# Patient Record
Sex: Female | Born: 1970 | Race: White | Hispanic: No | Marital: Married | Smoking: Never smoker
Health system: Southern US, Community
[De-identification: ages and names within clinical notes are randomized; demographics above are authoritative.]

## PROBLEM LIST (undated history)

## (undated) DIAGNOSIS — F419 Anxiety disorder, unspecified: Secondary | ICD-10-CM

## (undated) DIAGNOSIS — G4700 Insomnia, unspecified: Secondary | ICD-10-CM

## (undated) DIAGNOSIS — K589 Irritable bowel syndrome without diarrhea: Secondary | ICD-10-CM

## (undated) DIAGNOSIS — G43909 Migraine, unspecified, not intractable, without status migrainosus: Secondary | ICD-10-CM

## (undated) DIAGNOSIS — K219 Gastro-esophageal reflux disease without esophagitis: Secondary | ICD-10-CM

## (undated) DIAGNOSIS — R002 Palpitations: Secondary | ICD-10-CM

## (undated) HISTORY — DX: Palpitations: R00.2

## (undated) HISTORY — DX: Anxiety disorder, unspecified: F41.9

## (undated) HISTORY — PX: ABDOMINAL HYSTERECTOMY: SHX81

## (undated) HISTORY — DX: Insomnia, unspecified: G47.00

---

## 1997-04-21 ENCOUNTER — Inpatient Hospital Stay (HOSPITAL_COMMUNITY): Admission: AD | Admit: 1997-04-21 | Discharge: 1997-04-26 | Payer: Self-pay | Admitting: Obstetrics & Gynecology

## 1997-07-07 ENCOUNTER — Other Ambulatory Visit: Admission: RE | Admit: 1997-07-07 | Discharge: 1997-07-07 | Payer: Self-pay | Admitting: *Deleted

## 1997-11-18 ENCOUNTER — Ambulatory Visit (HOSPITAL_COMMUNITY): Admission: RE | Admit: 1997-11-18 | Discharge: 1997-11-18 | Payer: Self-pay | Admitting: Family Medicine

## 1999-09-20 ENCOUNTER — Emergency Department (HOSPITAL_COMMUNITY): Admission: EM | Admit: 1999-09-20 | Discharge: 1999-09-20 | Payer: Self-pay | Admitting: Emergency Medicine

## 1999-09-21 ENCOUNTER — Encounter: Payer: Self-pay | Admitting: Emergency Medicine

## 2000-04-12 ENCOUNTER — Emergency Department (HOSPITAL_COMMUNITY): Admission: EM | Admit: 2000-04-12 | Discharge: 2000-04-12 | Payer: Self-pay | Admitting: Emergency Medicine

## 2000-07-25 ENCOUNTER — Other Ambulatory Visit: Admission: RE | Admit: 2000-07-25 | Discharge: 2000-07-25 | Payer: Self-pay | Admitting: *Deleted

## 2000-08-12 ENCOUNTER — Encounter: Payer: Self-pay | Admitting: Family Medicine

## 2000-08-12 ENCOUNTER — Ambulatory Visit (HOSPITAL_COMMUNITY): Admission: RE | Admit: 2000-08-12 | Discharge: 2000-08-12 | Payer: Self-pay | Admitting: Family Medicine

## 2001-05-13 ENCOUNTER — Inpatient Hospital Stay (HOSPITAL_COMMUNITY): Admission: AD | Admit: 2001-05-13 | Discharge: 2001-05-13 | Payer: Self-pay | Admitting: Obstetrics & Gynecology

## 2001-05-19 ENCOUNTER — Other Ambulatory Visit: Admission: RE | Admit: 2001-05-19 | Discharge: 2001-05-19 | Payer: Self-pay | Admitting: Obstetrics & Gynecology

## 2001-09-07 ENCOUNTER — Inpatient Hospital Stay (HOSPITAL_COMMUNITY): Admission: AD | Admit: 2001-09-07 | Discharge: 2001-09-07 | Payer: Self-pay | Admitting: Obstetrics and Gynecology

## 2001-09-09 ENCOUNTER — Inpatient Hospital Stay (HOSPITAL_COMMUNITY): Admission: AD | Admit: 2001-09-09 | Discharge: 2001-09-10 | Payer: Self-pay | Admitting: *Deleted

## 2001-09-18 ENCOUNTER — Inpatient Hospital Stay (HOSPITAL_COMMUNITY): Admission: AD | Admit: 2001-09-18 | Discharge: 2001-09-20 | Payer: Self-pay | Admitting: *Deleted

## 2001-09-30 ENCOUNTER — Inpatient Hospital Stay (HOSPITAL_COMMUNITY): Admission: AD | Admit: 2001-09-30 | Discharge: 2001-10-03 | Payer: Self-pay | Admitting: Obstetrics and Gynecology

## 2001-10-01 ENCOUNTER — Encounter: Payer: Self-pay | Admitting: Obstetrics and Gynecology

## 2001-10-02 ENCOUNTER — Encounter: Payer: Self-pay | Admitting: Obstetrics and Gynecology

## 2001-10-20 ENCOUNTER — Inpatient Hospital Stay (HOSPITAL_COMMUNITY): Admission: AD | Admit: 2001-10-20 | Discharge: 2001-10-20 | Payer: Self-pay | Admitting: Obstetrics and Gynecology

## 2001-11-03 ENCOUNTER — Inpatient Hospital Stay (HOSPITAL_COMMUNITY): Admission: AD | Admit: 2001-11-03 | Discharge: 2001-11-03 | Payer: Self-pay | Admitting: Obstetrics and Gynecology

## 2001-11-05 ENCOUNTER — Inpatient Hospital Stay (HOSPITAL_COMMUNITY): Admission: AD | Admit: 2001-11-05 | Discharge: 2001-11-08 | Payer: Self-pay | Admitting: Obstetrics and Gynecology

## 2001-11-06 ENCOUNTER — Encounter: Payer: Self-pay | Admitting: Obstetrics and Gynecology

## 2001-11-07 ENCOUNTER — Encounter: Payer: Self-pay | Admitting: *Deleted

## 2001-11-11 ENCOUNTER — Inpatient Hospital Stay (HOSPITAL_COMMUNITY): Admission: AD | Admit: 2001-11-11 | Discharge: 2001-11-11 | Payer: Self-pay | Admitting: Obstetrics and Gynecology

## 2001-11-12 ENCOUNTER — Encounter: Payer: Self-pay | Admitting: Obstetrics and Gynecology

## 2001-11-12 ENCOUNTER — Inpatient Hospital Stay (HOSPITAL_COMMUNITY): Admission: AD | Admit: 2001-11-12 | Discharge: 2001-11-12 | Payer: Self-pay | Admitting: Obstetrics and Gynecology

## 2001-11-20 ENCOUNTER — Inpatient Hospital Stay (HOSPITAL_COMMUNITY): Admission: AD | Admit: 2001-11-20 | Discharge: 2001-11-22 | Payer: Self-pay | Admitting: Obstetrics and Gynecology

## 2001-12-28 ENCOUNTER — Other Ambulatory Visit: Admission: RE | Admit: 2001-12-28 | Discharge: 2001-12-28 | Payer: Self-pay | Admitting: *Deleted

## 2003-01-20 ENCOUNTER — Other Ambulatory Visit: Admission: RE | Admit: 2003-01-20 | Discharge: 2003-01-20 | Payer: Self-pay | Admitting: *Deleted

## 2004-01-25 ENCOUNTER — Other Ambulatory Visit: Admission: RE | Admit: 2004-01-25 | Discharge: 2004-01-25 | Payer: Self-pay | Admitting: Obstetrics and Gynecology

## 2004-05-02 ENCOUNTER — Ambulatory Visit: Payer: Self-pay | Admitting: Gastroenterology

## 2004-05-08 ENCOUNTER — Ambulatory Visit (HOSPITAL_COMMUNITY): Admission: RE | Admit: 2004-05-08 | Discharge: 2004-05-08 | Payer: Self-pay | Admitting: Gastroenterology

## 2004-05-08 ENCOUNTER — Encounter (INDEPENDENT_AMBULATORY_CARE_PROVIDER_SITE_OTHER): Payer: Self-pay | Admitting: *Deleted

## 2004-05-08 ENCOUNTER — Ambulatory Visit: Payer: Self-pay | Admitting: Gastroenterology

## 2004-05-16 ENCOUNTER — Ambulatory Visit: Payer: Self-pay | Admitting: Gastroenterology

## 2004-05-18 ENCOUNTER — Ambulatory Visit: Payer: Self-pay | Admitting: Gastroenterology

## 2004-05-23 ENCOUNTER — Encounter: Admission: RE | Admit: 2004-05-23 | Discharge: 2004-05-23 | Payer: Self-pay | Admitting: Family Medicine

## 2004-05-31 ENCOUNTER — Ambulatory Visit: Payer: Self-pay | Admitting: Gastroenterology

## 2004-06-04 ENCOUNTER — Ambulatory Visit (HOSPITAL_COMMUNITY): Admission: RE | Admit: 2004-06-04 | Discharge: 2004-06-04 | Payer: Self-pay | Admitting: Gastroenterology

## 2004-06-27 ENCOUNTER — Ambulatory Visit: Payer: Self-pay | Admitting: Gastroenterology

## 2004-12-13 ENCOUNTER — Ambulatory Visit: Payer: Self-pay | Admitting: Gastroenterology

## 2004-12-31 ENCOUNTER — Ambulatory Visit: Payer: Self-pay | Admitting: Gastroenterology

## 2005-02-27 ENCOUNTER — Other Ambulatory Visit: Admission: RE | Admit: 2005-02-27 | Discharge: 2005-02-27 | Payer: Self-pay | Admitting: Obstetrics and Gynecology

## 2005-05-30 ENCOUNTER — Ambulatory Visit (HOSPITAL_COMMUNITY): Admission: RE | Admit: 2005-05-30 | Discharge: 2005-06-01 | Payer: Self-pay | Admitting: Obstetrics and Gynecology

## 2005-05-30 ENCOUNTER — Encounter (INDEPENDENT_AMBULATORY_CARE_PROVIDER_SITE_OTHER): Payer: Self-pay | Admitting: *Deleted

## 2009-11-29 ENCOUNTER — Ambulatory Visit: Payer: Self-pay | Admitting: Family Medicine

## 2009-11-29 DIAGNOSIS — S139XXA Sprain of joints and ligaments of unspecified parts of neck, initial encounter: Secondary | ICD-10-CM

## 2009-11-29 DIAGNOSIS — G43009 Migraine without aura, not intractable, without status migrainosus: Secondary | ICD-10-CM | POA: Insufficient documentation

## 2009-11-30 ENCOUNTER — Telehealth (INDEPENDENT_AMBULATORY_CARE_PROVIDER_SITE_OTHER): Payer: Self-pay | Admitting: *Deleted

## 2009-11-30 ENCOUNTER — Encounter: Payer: Self-pay | Admitting: Family Medicine

## 2010-04-10 ENCOUNTER — Ambulatory Visit
Admission: RE | Admit: 2010-04-10 | Discharge: 2010-04-10 | Payer: Self-pay | Source: Home / Self Care | Admitting: Family Medicine

## 2010-04-10 ENCOUNTER — Encounter: Payer: Self-pay | Admitting: Family Medicine

## 2010-04-10 LAB — CONVERTED CEMR LAB: Rapid Strep: NEGATIVE

## 2010-04-10 NOTE — Assessment & Plan Note (Signed)
Summary: SEVERE HEADACHE/TJ Room 5   Vital Signs:  Patient Profile:   40 Years Old Female CC:      Headache since last night, stiff neck Height:     64 inches Weight:      144 pounds O2 Sat:      99 % Temp:     99.4 degrees F oral Pulse rate:   114 / minute Pulse rhythm:   regular Resp:     16 per minute BP sitting:   110 / 78  (left arm) Cuff size:   regular  Pt. in pain?   yes    Location:   head    Intensity:   7    Type:       aching  Vitals Entered By: Emilio Math, CMA                   Current Allergies: ! CODEINE    History of Present Illness Chief Complaint: Headache since last night, stiff neck History of Present Illness:  Subjective:  Patient has a history of recurring migraine headaches (about one per week) that usually respond to Maxalt.  About 2 to 3 days ago she developed some vague soreness in her left neck and posterior shoulder area that persisted.  At about 2AM today she began developing a typical right side migraine headache.  She took a Maxalt this morning without improvement, and later two Aleves without improvement.  She continues to soreness in her left neck and right sided headache. She is having mild nausea without vomiting.  No recent fevers, chills, and sweats.  No recent strenuous physical activity involving the left arm.  No known tick bites.  No respiratory symptoms. She states that she has had Toradol and Phenergan injection in the past without adverse effect.  Current Meds PAXIL 40 MG TABS (PAROXETINE HCL)  ZANTAC 300 MG TABS (RANITIDINE HCL)  CLARITIN 10 MG TABS (LORATADINE)  NORTRIPTYLINE HCL 10 MG CAPS (NORTRIPTYLINE HCL)  MAXALT 10 MG TABS (RIZATRIPTAN BENZOATE)  NAPROSYN 500 MG TABS (NAPROXEN)   REVIEW OF SYSTEMS Constitutional Symptoms      Denies fever, chills, night sweats, weight loss, weight gain, and fatigue.  Eyes       Denies change in vision, eye pain, eye discharge, glasses, contact lenses, and eye  surgery. Ear/Nose/Throat/Mouth       Denies hearing loss/aids, change in hearing, ear pain, ear discharge, dizziness, frequent runny nose, frequent nose bleeds, sinus problems, sore throat, hoarseness, and tooth pain or bleeding.  Respiratory       Denies dry cough, productive cough, wheezing, shortness of breath, asthma, bronchitis, and emphysema/COPD.  Cardiovascular       Denies murmurs, chest pain, and tires easily with exhertion.    Gastrointestinal       Denies stomach pain, nausea/vomiting, diarrhea, constipation, blood in bowel movements, and indigestion. Genitourniary       Denies painful urination, kidney stones, and loss of urinary control. Neurological       Complains of headaches and tremors.      Denies paralysis, seizures, and fainting/blackouts. Musculoskeletal       Complains of muscle pain and joint pain.      Denies joint stiffness, decreased range of motion, redness, swelling, muscle weakness, and gout.      Comments: Neck Skin       Denies bruising, unusual mles/lumps or sores, and hair/skin or nail changes.  Psych       Denies mood  changes, temper/anger issues, anxiety/stress, speech problems, depression, and sleep problems.  Past History:  Past Medical History: IBS Migraines  Past Surgical History: Hysterectomy  Family History: Father, COPD, HTN, Diabetes, Congestive Heart Failure Mother, Healthy  Social History: Non-smoker ETOH yes Drugs No Chemist   Objective:  Appearance:  Patient appears healthy, stated age, and in no acute distress  Eyes:  Pupils are equal, round, and reactive to light and accomdation.  Extraocular movement is intact.  Conjunctivae are not inflamed.  Fundi normal.  No photophobia. Ears:  Canals normal.  Tympanic membranes normal.   Nose:  Normal septum.  Normal turbinates, mildly congested.    No sinus tenderness present. Mouth:  Tongue midline Neck:  Supple.  No adenopathy is present.  No thyromegaly is present.  Mild  tenderness left trapezius and sternocleidomastoid muscles.   Left shoulder:  Tenderness along medial edge of left scapula.  Tenderness elicited with resisted abduction of left shoulder. Lungs:  Clear to auscultation.  Breath sounds are equal.  Heart:  Regular rate and rhythm without murmurs, rubs, or gallops.  Abdomen:  Nontender without masses or hepatosplenomegaly.  Bowel sounds are present.  No CVA or flank tenderness.  Skin:  No rash Neurologic:  Cranial nerves 2 through 12 are normal.  Patellar, achilles, and elbow reflexes are normal.  Cerebellar function is intact (finger to nose and rapid alternating movements)   Gait and station are normal.    CBC:  WBC 7.0; Hgb 13.3 Assessment New Problems: CERVICAL SPASM (ICD-847.0) MIGRAINE, COMMON (ICD-346.10)  SUSPECT LEFT RHOMBOID MUSCLE SPASM AS WELL.  Plan New Orders: Promethazine up to 50mg  [J2550] Admin of Therapeutic Inj  intramuscular or subcutaneous [96372] Ketorolac-Toradol 15mg  [J1885] New Patient Level III [99203] Planning Comments:   Toradol 60mg  IM and Phenergan 25mg  IM. Apply ice pack to painful areas 2 or 3 times daily.  Begin neck and left shoulder range of motion exercises in 2 or 3 days ([facility] information and instruction patient handout given)  Follow-up with PCP if not improving.  Return for worsening symptoms   The patient and/or caregiver has been counseled thoroughly with regard to medications prescribed including dosage, schedule, interactions, rationale for use, and possible side effects and they verbalize understanding.  Diagnoses and expected course of recovery discussed and will return if not improved as expected or if the condition worsens. Patient and/or caregiver verbalized understanding.   Medication Administration  Injection # 1:    Medication: Ketorolac-Toradol 15mg     Diagnosis: MIGRAINE, COMMON (ICD-346.10)    Route: IM    Site: RUOQ gluteus    Exp Date: 08/10/2011    Lot #: [id number]     Mfr: Hospira    Comments: Administered 60mg     Patient tolerated injection without complications    Given by: Areta Haber CMA (November 29, 2009 5:58 PM)  Injection # 2:    Medication: Promethazine up to 50mg     Diagnosis: MIGRAINE, COMMON (ICD-346.10)    Route: IM    Site: LUOQ gluteus    Exp Date: 06/09/2011    Lot #: [id number]    Mfr: Baxter    Comments: Administered 25mg     Patient tolerated injection without complications    Given by: Areta Haber CMA (November 29, 2009 5:59 PM)  Orders Added: 1)  Promethazine up to 50mg  [J2550] 2)  Admin of Therapeutic Inj  intramuscular or subcutaneous [96372] 3)  Ketorolac-Toradol 15mg  [J1885] 4)  New Patient Level III [11914]   Medication Administration  Injection #  1:    Medication: Ketorolac-Toradol 15mg     Diagnosis: MIGRAINE, COMMON (ICD-346.10)    Route: IM    Site: RUOQ gluteus    Exp Date: 08/10/2011    Lot #: [id number]    Mfr: Hospira    Comments: Administered 60mg     Patient tolerated injection without complications    Given by: Areta Haber CMA (November 29, 2009 5:58 PM)  Injection # 2:    Medication: Promethazine up to 50mg     Diagnosis: MIGRAINE, COMMON (ICD-346.10)    Route: IM    Site: LUOQ gluteus    Exp Date: 06/09/2011    Lot #: [id number]    Mfr: Baxter    Comments: Administered 25mg     Patient tolerated injection without complications    Given by: Areta Haber CMA (November 29, 2009 5:59 PM)  Orders Added: 1)  Promethazine up to 50mg  [J2550] 2)  Admin of Therapeutic Inj  intramuscular or subcutaneous [96372] 3)  Ketorolac-Toradol 15mg  [J1885] 4)  New Patient Level III [24401]

## 2010-04-10 NOTE — Letter (Signed)
Summary: Out of Work  [facility]  [address]   [place], [place] [address]   Phone: [phone]  Fax: [phone]    November 29, 2009   Employee:  LAVON HORN    To Whom It May Concern:   For Medical reasons, please excuse the above named employee from work today and tomorrow.    If you need additional information, please feel free to contact our office.         Sincerely,    Donna Christen MD

## 2010-04-10 NOTE — Progress Notes (Signed)
  Phone Note Outgoing Call Call back at [facility] Phone [phone]   Call placed by: Lajean Saver RN,  November 30, 2009 2:21 PM Call placed to: Patient  Follow-up for Phone Call        Call Back: Message left with reason for call and call back with any questions or concerns Follow-up by: Lajean Saver RN,  November 30, 2009 2:22 PM

## 2010-04-18 NOTE — Assessment & Plan Note (Signed)
Summary: FEVER,COUGH,CHEST PAIN/TJ (rm 4)   Vital Signs:  Patient Profile:   40 Years Old Female CC:      dry cough x 5 days, fever and wheeze x 2 days Height:     64 inches Weight:      145 pounds O2 Sat:      98 % O2 treatment:    Room Air Temp:     98.8 degrees F oral Pulse rate:   107 / minute Resp:     18 per minute BP sitting:   115 / 82  (left arm) Cuff size:   regular  Vitals Entered By: Lajean Saver RN (April 10, 2010 6:03 PM)                  Updated Prior Medication List: PAXIL 40 MG TABS (PAROXETINE HCL)  ZANTAC 300 MG TABS (RANITIDINE HCL)  CLARITIN 10 MG TABS (LORATADINE)  NORTRIPTYLINE HCL 10 MG CAPS (NORTRIPTYLINE HCL)  MAXALT 10 MG TABS (RIZATRIPTAN BENZOATE)  NAPROSYN 500 MG TABS (NAPROXEN)   Current Allergies: ! CODEINE ! PREDNISONEHistory of Present Illness Chief Complaint: dry cough x 5 days, fever and wheeze x 2 days History of Present Illness:  Subjective: Patient complains of URI symptoms that started 5 days ago with anterior chest tightness and a non-productive cough.  She had pneumonia about 10 years ago.  She notes that she has not had a Tdap.  She has had her seasonal flu shot. + mild sore throat No pleuritic pain ? wheezing; she had exercise induced asthma in distant past. No nasal congestion No post-nasal drainage No sinus pain/pressure No itchy/red eyes No earache No hemoptysis No SOB + fever/chills to 100.5 No nausea No vomiting No abdominal pain No diarrhea No skin rashes +fatigue No myalgias No headache    REVIEW OF SYSTEMS Constitutional Symptoms       Complains of fever.     Denies chills, night sweats, weight loss, weight gain, and fatigue.  Eyes       Denies change in vision, eye pain, eye discharge, glasses, contact lenses, and eye surgery. Ear/Nose/Throat/Mouth       Denies hearing loss/aids, change in hearing, ear pain, ear discharge, dizziness, frequent runny nose, frequent nose bleeds, sinus problems,  sore throat, hoarseness, and tooth pain or bleeding.  Respiratory       Complains of dry cough and wheezing.      Denies productive cough, shortness of breath, asthma, bronchitis, and emphysema/COPD.  Cardiovascular       Complains of chest pain.      Denies murmurs and tires easily with exhertion.      Comments: with coughing   Gastrointestinal       Denies stomach pain, nausea/vomiting, diarrhea, constipation, blood in bowel movements, and indigestion. Genitourniary       Denies painful urination, kidney stones, and loss of urinary control. Neurological       Denies paralysis, seizures, and fainting/blackouts. Musculoskeletal       Denies muscle pain, joint pain, joint stiffness, decreased range of motion, redness, swelling, muscle weakness, and gout.  Skin       Denies bruising, unusual mles/lumps or sores, and hair/skin or nail changes.  Psych       Denies mood changes, temper/anger issues, anxiety/stress, speech problems, depression, and sleep problems. Other Comments: taken Nyquil OTC   Past History:  Past Medical History: Reviewed history from 11/29/2009 and no changes required. IBS Migraines  Past Surgical History: Reviewed history from  11/29/2009 and no changes required. Hysterectomy  Family History: Reviewed history from 11/29/2009 and no changes required. Father, COPD, HTN, Diabetes, Congestive Heart Failure Mother, Healthy  Social History: Reviewed history from 11/29/2009 and no changes required. Non-smoker ETOH yes Drugs No Chemist   Objective:  Appearance:  Patient appears healthy, stated age, and in no acute distress  Eyes:  Pupils are equal, round, and reactive to light and accomdation.  Extraocular movement is intact.  Conjunctivae are not inflamed.  Ears:  Canals normal.  Tympanic membranes normal.   Nose:  No sinus congestion Pharynx:  Minimal erythema Neck:  Supple.  Slightly tender shotty anterior nodes are palpated bilaterally.  Lungs:   Clear to auscultation.  Breath sounds are equal.  Chest:  Distinct tenderness over mid sternum Heart:  Regular rate and rhythm without murmurs, rubs, or gallops.  Abdomen:  Nontender without masses or hepatosplenomegaly.  Bowel sounds are present.  No CVA or flank tenderness.  Extremities:  No edema.  Skin:  No rash Rapid strep test negative  Assessment New Problems: UPPER RESPIRATORY INFECTION, ACUTE (ICD-465.9)  ? PERTUSSIS VS VIRAL URI  Plan New Medications/Changes: BENZONATATE 200 MG CAPS (BENZONATATE) One by mouth hs as needed cough  #12 x 0, 04/10/2010, Donna Christen MD AZITHROMYCIN 250 MG TABS (AZITHROMYCIN) Two tabs by mouth on day 1, then 1 tab daily on days 2 through 5  #6 tabs x 0, 04/10/2010, Donna Christen MD  New Orders: Pulse Oximetry (single measurment) [94760] Rapid Strep [09811] Est. Patient Level III [91478] Planning Comments:   Begin Z-pack, expectorant, cough suppressant at bedtime.  Increase fluid intake.  Add decongestant if sinus congestion develops. Followup with PCP if not improving 7 to 10 days   The patient and/or caregiver has been counseled thoroughly with regard to medications prescribed including dosage, schedule, interactions, rationale for use, and possible side effects and they verbalize understanding.  Diagnoses and expected course of recovery discussed and will return if not improved as expected or if the condition worsens. Patient and/or caregiver verbalized understanding.  Prescriptions: BENZONATATE 200 MG CAPS (BENZONATATE) One by mouth hs as needed cough  #12 x 0   Entered and Authorized by:   Donna Christen MD   Signed by:   Donna Christen MD on 04/10/2010   Method used:   Print then Give to Patient   RxID:   [id number] AZITHROMYCIN 250 MG TABS (AZITHROMYCIN) Two tabs by mouth on day 1, then 1 tab daily on days 2 through 5  #6 tabs x 0   Entered and Authorized by:   Donna Christen MD   Signed by:   Donna Christen MD on 04/10/2010    Method used:   Print then Give to Patient   RxID:   [id number]   Patient Instructions: 1)  Take Mucinex  (guaifenesin) twice daily for congestion. 2)  Increase fluid intake, rest. 3)  If sinus congestion develops, may switch to Mucinex D. 4)  May also  use Afrin nasal spray (or generic oxymetazoline) twice daily for about 5 days.  Also recommend using saline nasal spray several times daily and/or saline nasal irrigation. 5)   Followup with family doctor if not improving 7 to 10 days.   Orders Added: 1)  Pulse Oximetry (single measurment) [94760] 2)  Rapid Strep [72536] 3)  Est. Patient Level III [64403]    Laboratory Results  Date/Time Received: April 10, 2010 6:51 PM  Date/Time Reported: April 10, 2010 6:51 PM   Other Tests  Rapid Strep: negative  Kit Test Internal QC: Negative   (Normal Range: Negative)

## 2010-04-18 NOTE — Letter (Signed)
Summary: Out of Work  [facility]  [address]   [place], [place] [address]   Phone: [phone]  Fax: [phone]    April 10, 2010   Employee:  Beverly Shepherd    To Whom It May Concern:   For Medical reasons, please excuse the above named employee from work today.   If you need additional information, please feel free to contact our office.         Sincerely,    Donna Christen MD

## 2010-07-27 NOTE — H&P (Signed)
NAME:  Beverly Shepherd, Beverly Shepherd             ACCOUNT NO.:  [id number]   MEDICAL RECORD NO.:  [id number]          PATIENT TYPE:  AMB   LOCATION:  SDC                           FACILITY:  [facility]   PHYSICIAN:  Dineen Kid. Rana Snare, M.D.    DATE OF BIRTH:  [birth year]   DATE OF ADMISSION:  05/30/2005  DATE OF DISCHARGE:                                HISTORY & PHYSICAL   HISTORY OF PRESENT ILLNESS:  Beverly Shepherd is a 41 year old gravida 2, para  2, who has been having problems with worsening dyspareunia and pelvic pain.  She has a history of endometriosis, has had surgery in the past for this.  At this time, she continues to have dyspareunia.  A lot of times, she will  also have associated diarrhea after intercourse as well.  She desires  definitive surgical intervention and wishes to proceed with hysterectomy.  Her husband has had a vasectomy in the past, so no childbearing desires.  Both the patient and her husband have been thinking about this for a long  time and they do want to proceed with this course of action.  She has had a  laparoscopy in the past without much success.  She is also seeing a GI  doctor and tried different medications without success.  She does have a  history of IBS as well.  She also desires preservation of her ovaries.   PAST MEDICAL HISTORY:  She has a history significant for irritable bowel  syndrome, history of S reflux disease, history of asthma, and also a history  of depression and anxiety.   MEDICATIONS:  1.  Paxil CR.  2.  Zantac.  3.  Levbid.  4.  Bentyl.  5.  Darvocet.  6.  Vicodin as needed for pain.   ALLERGIES:  CODEINE and PREDNISONE.   PAST SURGICAL HISTORY:  She had a laparoscopy for endometriosis.   PAST OBSTETRICAL HISTORY:  She has had two vaginal deliveries.   PHYSICAL EXAMINATION:  VITAL SIGNS:  Blood pressure 112/58, weight 105.  HEART:  Regular rate and rhythm.  LUNGS:  Clear to auscultation bilaterally.  ABDOMEN:  Nondistended and nontender.  PELVIC:  She has normal external genitalia, Bartholin's, Skene's, and  urethra.  The uterus is anteverted, mobile, and nontender.  There is no  uterosacral nodularity palpable.  No adnexal masses are palpable.  She does  have mild tenderness to movement of the uterus.  No rebound or guarding is  noted.   Ultrasound evaluation is normal.  Laparoscopic evaluation previously showed  mild endometriosis.  Pain did not improve after the surgery.   IMPRESSION:  1.  Dyspareunia and pelvic pain.  Minimal response to conservative medical      management which has included laparoscopy, oral contraceptive agents,      and also the patient declines Depo-Lupron.  She desires definitive      surgical intervention and requests hysterectomy.  We discussed the      risks, benefits, recovery time, and the procedure at length.  The risks      and benefits include, but are not limited to risks  of infection,      bleeding, damage to the bowel, bladder, ureters, ovaries, risks      associated with anesthesia, risks associated with blood transfusion,      possibility that the pain might not improve and it might actually      worsen.  She does give her informed consent and wishes to proceed.      Dineen Kid Rana Snare, M.D.  Electronically Signed     DCL/MEDQ  D:  05/29/2005  T:  05/30/2005  Job:  [id number]

## 2010-07-27 NOTE — Discharge Summary (Signed)
   NAME:  Beverly Shepherd, Beverly Shepherd NO.:  [id number]   MEDICAL RECORD NO.:  [id number]                   PATIENT TYPE:  INP   LOCATION:  9159                                 FACILITY:  [facility]   PHYSICIAN:  Tracie Harrier, M.D.              DATE OF BIRTH:  [birth year]   DATE OF ADMISSION:  11/05/2001  DATE OF DISCHARGE:  11/08/2001                                 DISCHARGE SUMMARY   ADMITTING DIAGNOSES:  1. Intrauterine pregnancy at 84 and 4/7 weeks estimated gestational age.  2. Preterm labor.   DISCHARGE DIAGNOSES:  1. Intrauterine pregnancy at 35 weeks estimated gestational age.  2. Preterm labor on oral tocolytics.   REASON FOR ADMISSION:  Please see written H&P.   HOSPITAL COURSE:  The patient was admitted to [facility] at  34 and 4/7 weeks estimated gestational age for complaints of preterm labor.  The patient had been on oral tocolytics.  She was previously hospitalized  for preterm labor with this pregnancy.  Betamethasone was given with her  last admission.  On admission cervix was noted to be 1-2 cm dilated, 50%  effaced, vertex presentation with intact membranes.  Group B beta Strep  culture was obtained.  IV antibiotics were administered prophylactically.  Oral tocolytics were discontinued and magnesium sulfate drip was started.  Fetal heart tones were reassuring with a baseline of 120-130s.  One episode  of a variable deceleration was noted in the 90s with good recovery.  Biophysical profile was obtained with results of 10/10.  Amnio was performed  for assessment of fetal lung maturity.  Magnesium sulfate was discontinued  and oral tocolytics were restarted.  Amniostat was negative.  Betamethasone  was also repeated.  Contractions were irregular.  Cervix was unchanged.  The  patient was discharged home.   CONDITION ON DISCHARGE:  Good.   DIET:  Regular, as tolerated.   ACTIVITY:  Bed rest with bathroom privileges only.   FOLLOW UP:  The patient is to follow up in the office in four days for an OB  check and nonstress test.  She is to call for contractions greater than four  times per hour, increasing pressure, or ruptured membranes.  She is also to  call for a decrease in fetal movement.   DISCHARGE MEDICATIONS:  1. Terbutaline 2.5 mg orally q.3-4h.  2. Prenatal vitamins one p.o. daily.  3. Paxil CR 25 mg one p.o. in the a.m.  4. Zantac 150 mg p.o. one q.12h.    Julio Sicks, NP                          Tracie Harrier, M.D.   CC/MEDQ  D:  12/17/2001  T:  12/18/2001  Job:  [id number]

## 2010-07-27 NOTE — Op Note (Signed)
NAME:  Beverly Shepherd, Beverly Shepherd             ACCOUNT NO.:  [id number]   MEDICAL RECORD NO.:  [id number]          PATIENT TYPE:  INP   LOCATION:  9318                          FACILITY:  [facility]   PHYSICIAN:  Dineen Kid. Rana Snare, M.D.    DATE OF BIRTH:  11/03/1970   DATE OF PROCEDURE:  05/30/2005  DATE OF DISCHARGE:  06/01/2005                                 OPERATIVE REPORT   PREOPERATIVE DIAGNOSES:  Dyspareunia, pelvic pain and history of  endometriosis.   POSTOPERATIVE DIAGNOSES:  Dyspareunia, pelvic pain and history of  endometriosis.   PROCEDURE:  Laparoscopically assisted vaginal hysterectomy.   SURGEON:  Dineen Kid. Rana Snare, M.D.   ASSISTANTFreddy Finner, M.D.   ANESTHESIA:  General endotracheal.   INDICATIONS FOR PROCEDURE:  Beverly Shepherd is a 40 year old, G2, P2 with  problems with dyspareunia, pelvic pain, and a history of endometriosis. She  has had surgeries in the past for this but continues to have dyspareunia.  She desires surgical intervention and wishes to proceed with hysterectomy.  Her husband has had a vasectomy in the past so she has no childbearing  desires. Both the patient and her husband have been thinking about this for  a long time and they do want to proceed with this. She also has seen a GI  doctor and tried different medications without success for her irritable  bowel. She also desires preservation of the ovaries. The risks and benefits  of the procedure were discussed at length. Informed consent was obtained.   FINDINGS:  Normal appearing uterus, no evidence of endometriosis, normal  appearing ovaries and bowel.   DESCRIPTION OF PROCEDURE:  After adequate anesthesia, the patient placed in  the dorsal lithotomy position, she was sterilely prepped and draped, the  bladder sterilely drained. A Graves speculum was placed and a hulka  tenaculum was placed in the cervix. A 1 cm infraumbilical skin incision was  made, a Veress needle was inserted, the abdomen was  insufflated with  dullness to percussion, an 11 mm trocar was inserted and a laparoscope was  then inserted and the above findings were noted. A 5 mm trocar was inserted  to the left of the midline 2 fingerbreaths above the pubic symphysis under  direct visualization. A gyrus cutting forceps was used to cut across the  left and right utero-ovarian ligaments, these ligaments were taken down  sharply across the round ligament with the ovaries and fallopian tubes  falling laterally into the uterus and good hemostasis achieved. The bladder  was then elevated, uterovesical junction was noted, a small incision was  made to create a small bladder flap. The abdomen was then desufflated, legs  repositioned, posterior colpotomy was then performed. The cervix was  circumscribed with Bovie cautery. A ligature instrument was used __________  across the uterosacral ligaments bilaterally and the cardinal ligaments  bilaterally. The anterior vaginal mucosa was dissected off the anterior  cervix. The anterior peritoneum was entered sharply and a Deaver retractor  was placed underneath the bladder. The inferior portion of the broad  ligaments were clamped bilaterally with a Ligasure instrument, ligated  and  dissected with Mayo scissors. The uterus removed intact. The uterosacral  ligaments were then ligated with #0 Monocryl suture in a figure-of-eight  fashion. The posterior peritoneum was then closed in a pursestring fashion  using #0 Monocryl suture. The vagina was then closed in a vertical fashion  using figure-of-eights of #0 Monocryl suture with good approximation and  good hemostasis. A Foley catheter was placed for return of clear yellow  urine. The legs were repositioned, the abdomen was reinsufflated and after a  copious amount of irrigation adequate hemostasis was assured. The abdomen  was then desufflated, trocars removed. The infraumbilical skin incisions  were closed with #0 Vicryl and  interrupted suture in the fascia. 3-0 Vicryl  Rapide subcuticular suture in the skin and the 5 mm site was closed with 3-0  Vicryl Rapide subcuticular suture. The patient tolerated the procedure well  and was stable on transfer to the recovery room. Sponge, instrument and  needle count was correct x3. Estimated blood loss 200 mL. The patient  received 1 gram of Rocephin preoperatively.      Dineen Kid Rana Snare, M.D.  Electronically Signed     DCL/MEDQ  D:  06/23/2005  T:  06/24/2005  Job:  [id number]

## 2010-07-27 NOTE — Discharge Summary (Signed)
NAME:  Beverly Shepherd, Beverly Shepherd             ACCOUNT NO.:  [id number]   MEDICAL RECORD NO.:  [id number]          PATIENT TYPE:  OIB   LOCATION:  9318                          FACILITY:  [facility]   PHYSICIAN:  Dineen Kid. Rana Snare, M.D.    DATE OF BIRTH:  [birth year]   DATE OF ADMISSION:  05/30/2005  DATE OF DISCHARGE:  06/01/2005                                 DISCHARGE SUMMARY   HISTORY OF PRESENT ILLNESS:  Ms. Gafford is a 40 year old G2, P2 with  worsening dyspareunia and pelvic pain, history of endometriosis, not  responsive to conservative medical management which has included laparoscopy  in the past.  She desires definitive surgical intervention and presents for  hysterectomy.  Her husband has had a vasectomy in the past, so has no  childbearing desires.  She also has a history of irritable bowel syndrome  and has been seeing a GI doctor for this.   HOSPITAL COURSE:  The patient underwent a laparoscopic assisted vaginal  hysterectomy.  The surgery was uncomplicated.  Estimated blood loss at the  time of surgery was 200 mL.  The patient's postoperative care, she had a  postoperative hemoglobin of 10.3 and on the morning after surgery began  slowly able to tolerate small amounts of regular diet and by the end of  postoperative day #1, began with febrile morbidity of temperature of 100.7  and generalized malaise and decreased appetite.  Because of this, we did go  ahead and check a urinalysis, which was negative.  There were no focal signs  of infection.  We did go ahead and place her on IV Rocephin.  CBC showed a  normal white count of 7.  On postoperative day #2, the patient was feeling  better, the fever was gone, temperature 98.5, white count 7.5.  urinalysis  negative.  Lungs were clear to auscultation bilaterally.  Abdomen was soft,  nontender and nondistended with the incision clean, dry and intact and the  patient decided she did want to be discharged home at this point.  Most  likely, the  febrile morbidity was from atelectasis.   DISPOSITION:  The patient will be discharged home and will follow up in the  office in 1-2 weeks, told to return for increased pain, fever, bleeding and  sent home with a prescription for Tylox #50 and Ceftin 250 mg p.o. b.i.d. x5  days.      Dineen Kid Rana Snare, M.D.  Electronically Signed     DCL/MEDQ  D:  06/01/2005  T:  06/03/2005  Job:  [id number]

## 2012-10-15 ENCOUNTER — Other Ambulatory Visit: Payer: Self-pay | Admitting: Family Medicine

## 2012-10-15 ENCOUNTER — Ambulatory Visit
Admission: RE | Admit: 2012-10-15 | Discharge: 2012-10-15 | Disposition: A | Discharge status: Home / Self Care | Payer: BC Managed Care – PPO | Source: Ambulatory Visit | Attending: Family Medicine | Admitting: Family Medicine

## 2012-10-15 DIAGNOSIS — M79671 Pain in right foot: Secondary | ICD-10-CM

## 2013-06-15 ENCOUNTER — Emergency Department
Admission: EM | Admit: 2013-06-15 | Discharge: 2013-06-15 | Disposition: A | Discharge status: Home / Self Care | Payer: BC Managed Care – PPO | Source: Home / Self Care | Attending: Emergency Medicine | Admitting: Emergency Medicine

## 2013-06-15 ENCOUNTER — Encounter: Payer: Self-pay | Admitting: Emergency Medicine

## 2013-06-15 DIAGNOSIS — J069 Acute upper respiratory infection, unspecified: Secondary | ICD-10-CM

## 2013-06-15 HISTORY — DX: Gastro-esophageal reflux disease without esophagitis: K21.9

## 2013-06-15 HISTORY — DX: Migraine, unspecified, not intractable, without status migrainosus: G43.909

## 2013-06-15 HISTORY — DX: Irritable bowel syndrome, unspecified: K58.9

## 2013-06-15 MED ORDER — AMOXICILLIN-POT CLAVULANATE 875-125 MG PO TABS: 1.0000 | ORAL_TABLET | Freq: Two times a day (BID) | ORAL | Status: DC

## 2013-06-15 MED ORDER — GUAIFENESIN-CODEINE 100-10 MG/5ML PO SYRP: 5.0000 mL | ORAL_SOLUTION | Freq: Four times a day (QID) | ORAL | Status: DC | PRN

## 2013-06-15 NOTE — ED Provider Notes (Signed)
CSN: [id number]     Arrival date & time 06/15/13  1823 History   First MD Initiated Contact with Patient 06/15/13 1833     Chief Complaint  Patient presents with  . Cough  . Fever   (Consider location/radiation/quality/duration/timing/severity/associated sxs/prior Treatment) HPI [place] is a 43 y.o. female who complains of onset of cold symptoms for 4-5 days.  The symptoms are constant and mild-moderate in severity.  Taking OTC cough & cold meds.  On Lithium. + sore throat + cough No pleuritic pain No wheezing + nasal congestion + post-nasal drainage + sinus pain/pressure + chest congestion No itchy/red eyes No earache No hemoptysis + chills/sweats No nausea No vomiting No abdominal pain + diarrhea No skin rashes + fatigue + myalgias     Past Medical History  Diagnosis Date  . Migraine   . IBS (irritable bowel syndrome)   . GERD (gastroesophageal reflux disease)    Past Surgical History  Procedure Laterality Date  . Abdominal hysterectomy     Family History  Problem Relation Age of Onset  . Heart failure Father   . Diabetes Father    History  Substance Use Topics  . Smoking status: Never Smoker   . Smokeless tobacco: Not on file  . Alcohol Use: Yes   OB History   Grav Para Term Preterm Abortions TAB SAB Ect Mult Living                 Review of Systems  All other systems reviewed and are negative.    Allergies  Codeine and Prednisone  Home Medications   Current Outpatient Rx  Name  Route  Sig  Dispense  Refill  . guaiFENesin (MUCINEX) 600 MG 12 hr tablet   Oral   Take by mouth 2 (two) times daily.         Marland Kitchen ibuprofen (ADVIL,MOTRIN) 600 MG tablet   Oral   Take 600 mg by mouth every 6 (six) hours as needed.         . lithium carbonate 300 MG capsule   Oral   Take 300 mg by mouth 3 (three) times daily with meals.         Marland Kitchen loratadine (CLARITIN REDITABS) 10 MG dissolvable tablet   Oral   Take 10 mg by mouth daily.         .  mirtazapine (REMERON) 15 MG tablet   Oral   Take 15 mg by mouth at bedtime.         Marland Kitchen PARoxetine (PAXIL) 40 MG tablet   Oral   Take 40 mg by mouth every morning.         . Pseudoeph-Doxylamine-DM-APAP (NYQUIL PO)   Oral   Take by mouth.         . ranitidine (ZANTAC) 300 MG tablet   Oral   Take 300 mg by mouth at bedtime.         Marland Kitchen amoxicillin-clavulanate (AUGMENTIN) 875-125 MG per tablet   Oral   Take 1 tablet by mouth 2 (two) times daily.   14 tablet   0   . guaiFENesin-codeine (ROBITUSSIN AC) 100-10 MG/5ML syrup   Oral   Take 5 mLs by mouth 4 (four) times daily as needed for cough or congestion.   120 mL   0    BP 130/90  Pulse 97  Temp(Src) 98.2 F (36.8 C) (Oral)  Resp 18  Ht 5\' 4"  (1.626 m)  Wt 163 lb (73.936 kg)  BMI 27.97  kg/m2  SpO2 98% Physical Exam  Nursing note and vitals reviewed. Constitutional: She is oriented to person, place, and time. Vital signs are normal. She appears well-developed and well-nourished.  HENT:  Head: Normocephalic and atraumatic.  Right Ear: Tympanic membrane, external ear and ear canal normal.  Left Ear: Tympanic membrane, external ear and ear canal normal.  Nose: Mucosal edema and rhinorrhea present.  Mouth/Throat: Posterior oropharyngeal erythema present. No oropharyngeal exudate or posterior oropharyngeal edema.  Eyes: No scleral icterus.  Neck: Neck supple.  Cardiovascular: Regular rhythm and normal heart sounds.   Pulmonary/Chest: Effort normal and breath sounds normal. No respiratory distress. She has no decreased breath sounds. She has no wheezes. She has no rhonchi.  Neurological: She is alert and oriented to person, place, and time.  Skin: Skin is warm and dry.  Psychiatric: She has a normal mood and affect. Her speech is normal.    ED Course  Procedures (including critical care time) Labs Review Labs Reviewed - No data to display Imaging Review No results found.   MDM   1. Acute upper respiratory  infections of unspecified site    1)  Take the prescribed antibiotic as instructed.  Penicillins usually ok with lithium.  Pt requests cough medicine.  She is allergic to "high doses of codeine" but has taken codeine cough medicine previously with no problems. 2)  Use nasal saline solution (over the counter) at least 3 times a day. 3)  Use over the counter decongestants like Zyrtec-D every 12 hours as needed to help with congestion.  If you have hypertension, do not take medicines with sudafed.  4)  Can take tylenol every 6 hours or motrin every 8 hours for pain or fever. 5)  Follow up with your primary doctor if no improvement in 5-7 days, sooner if increasing pain, fever, or new symptoms.     Marlaine HindJeffrey H Henderson, MD 06/15/13 [phone]

## 2013-06-15 NOTE — ED Notes (Signed)
Pt c/o nonproductive cough x 4 days. She also c/o fever and runny nose x 2 days.

## 2014-01-17 ENCOUNTER — Emergency Department
Admission: EM | Admit: 2014-01-17 | Discharge: 2014-01-17 | Disposition: A | Discharge status: Home / Self Care | Payer: BC Managed Care – PPO | Source: Home / Self Care | Attending: Emergency Medicine | Admitting: Emergency Medicine

## 2014-01-17 ENCOUNTER — Encounter: Payer: Self-pay | Admitting: *Deleted

## 2014-01-17 DIAGNOSIS — R059 Cough, unspecified: Secondary | ICD-10-CM

## 2014-01-17 DIAGNOSIS — R05 Cough: Secondary | ICD-10-CM

## 2014-01-17 MED ORDER — GUAIFENESIN-CODEINE 100-10 MG/5ML PO SYRP: 5.0000 mL | ORAL_SOLUTION | Freq: Four times a day (QID) | ORAL | Status: DC | PRN

## 2014-01-17 NOTE — Discharge Instructions (Signed)
Cough, Adult   A cough is a reflex. It helps you clear your throat and airways. A cough can help heal your body. A cough can last 2 or 3 weeks (acute) or may last more than 8 weeks (chronic). Some common causes of a cough can include an infection, allergy, or a cold.  HOME CARE  · Only take medicine as told by your doctor.  · If given, take your medicines (antibiotics) as told. Finish them even if you start to feel better.  · Use a cold steam vaporizer or humidifier in your home. This can help loosen thick spit (secretions).  · Sleep so you are almost sitting up (semi-upright). Use pillows to do this. This helps reduce coughing.  · Rest as needed.  · Stop smoking if you smoke.  GET HELP RIGHT AWAY IF:  · You have yellowish-white fluid (pus) in your thick spit.  · Your cough gets worse.  · Your medicine does not reduce coughing, and you are losing sleep.  · You cough up blood.  · You have trouble breathing.  · Your pain gets worse and medicine does not help.  · You have a fever.  MAKE SURE YOU:   · Understand these instructions.  · Will watch your condition.  · Will get help right away if you are not doing well or get worse.  Document Released: 11/08/2010 Document Revised: 07/12/2013 Document Reviewed: 11/08/2010  [employer]® Patient Information ©2015 [employer]. This information is not intended to replace advice given to you by your health care provider. Make sure you discuss any questions you have with your health care provider.

## 2014-01-17 NOTE — ED Provider Notes (Signed)
CSN: [id number]     Arrival date & time 01/17/14  1832 History   First MD Initiated Contact with Patient 01/17/14 1853     Chief Complaint  Patient presents with  . Cough  . Nasal Congestion   (Consider location/radiation/quality/duration/timing/severity/associated sxs/prior Treatment) Patient is a 43 y.o. female presenting with cough. The history is provided by the patient. No language interpreter was used.  Cough Cough characteristics:  Non-productive Severity:  Moderate Onset quality:  Gradual Duration:  2 days Timing:  Constant Progression:  Worsening Chronicity:  New Smoker: no   Context: upper respiratory infection   Relieved by:  Nothing Worsened by:  Nothing tried Ineffective treatments:  None tried Associated symptoms: no fever     Past Medical History  Diagnosis Date  . Migraine   . IBS (irritable bowel syndrome)   . GERD (gastroesophageal reflux disease)    Past Surgical History  Procedure Laterality Date  . Abdominal hysterectomy     Family History  Problem Relation Age of Onset  . Heart failure Father   . Diabetes Father    History  Substance Use Topics  . Smoking status: Never Smoker   . Smokeless tobacco: Not on file  . Alcohol Use: Yes   OB History    No data available    Pt request cough syrup.   Pt states she can take codeine cough syrup.  She is allergic to Codeine #5. Review of Systems  Constitutional: Negative for fever.  HENT: Positive for congestion.   Respiratory: Positive for cough.   All other systems reviewed and are negative.   Allergies  Codeine and Prednisone  Home Medications   Prior to Admission medications   Medication Sig Start Date End Date Taking? Authorizing Provider  dicyclomine (BENTYL) 20 MG tablet Take 30 mg by mouth every 6 (six) hours.   Yes Historical Provider, MD  lithium carbonate 300 MG capsule Take 300 mg by mouth 3 (three) times daily with meals.   Yes Historical Provider, MD  loratadine (CLARITIN  REDITABS) 10 MG dissolvable tablet Take 10 mg by mouth daily.   Yes Historical Provider, MD  mirtazapine (REMERON) 15 MG tablet Take 15 mg by mouth at bedtime.   Yes Historical Provider, MD  PARoxetine (PAXIL) 40 MG tablet Take 40 mg by mouth every morning.   Yes Historical Provider, MD  ranitidine (ZANTAC) 300 MG tablet Take 300 mg by mouth at bedtime.   Yes Historical Provider, MD  amoxicillin-clavulanate (AUGMENTIN) 875-125 MG per tablet Take 1 tablet by mouth 2 (two) times daily. 06/15/13   Marlaine HindJeffrey H Henderson, MD  guaiFENesin (MUCINEX) 600 MG 12 hr tablet Take by mouth 2 (two) times daily.    Historical Provider, MD  guaiFENesin-codeine (ROBITUSSIN AC) 100-10 MG/5ML syrup Take 5 mLs by mouth 4 (four) times daily as needed for cough or congestion. 06/15/13   Marlaine HindJeffrey H Henderson, MD  ibuprofen (ADVIL,MOTRIN) 600 MG tablet Take 600 mg by mouth every 6 (six) hours as needed.    Historical Provider, MD  Pseudoeph-Doxylamine-DM-APAP (NYQUIL PO) Take by mouth.    Historical Provider, MD   BP 129/88 mmHg  Pulse 98  Temp(Src) 98.6 F (37 C) (Oral)  Resp 16  Ht 5\' 4"  (1.626 m)  Wt 163 lb (73.936 kg)  BMI 27.97 kg/m2  SpO2 97% Physical Exam  Constitutional: She is oriented to person, place, and time. She appears well-developed and well-nourished.  HENT:  Head: Normocephalic and atraumatic.  Right Ear: External ear normal.  Eyes: Conjunctivae and EOM are normal. Pupils are equal, round, and reactive to light.  Neck: Normal range of motion.  Cardiovascular: Normal rate and normal heart sounds.   Pulmonary/Chest: Effort normal.  Abdominal: She exhibits no distension.  Musculoskeletal: Normal range of motion.  Neurological: She is alert and oriented to person, place, and time.  Skin: Skin is warm.  Psychiatric: She has a normal mood and affect.  Nursing note and vitals reviewed.   ED Course  Procedures (including critical care time) Labs Review Labs Reviewed - No data to display  Imaging  Review No results found.   MDM  I suspect illness is viral.   I advised follow up with primary.   I will rx for cough syrup.     1. Cough    Pt given guifenesin and codeine    Elson AreasLeslie K Sofia, PA-C 01/17/14 1918

## 2014-01-17 NOTE — ED Notes (Signed)
Pt c/o cough, nasal congestion, and sneezing x 2 days.

## 2014-11-16 ENCOUNTER — Encounter: Payer: Self-pay | Admitting: Internal Medicine

## 2014-12-08 ENCOUNTER — Encounter: Payer: Self-pay | Admitting: *Deleted

## 2014-12-12 ENCOUNTER — Ambulatory Visit (INDEPENDENT_AMBULATORY_CARE_PROVIDER_SITE_OTHER): Payer: BLUE CROSS/BLUE SHIELD | Admitting: Internal Medicine

## 2014-12-12 ENCOUNTER — Encounter: Payer: Self-pay | Admitting: Internal Medicine

## 2014-12-12 VITALS — BP 110/80 | HR 94 | Ht 64.0 in | Wt 159.0 lb

## 2014-12-12 DIAGNOSIS — R002 Palpitations: Secondary | ICD-10-CM

## 2014-12-12 NOTE — Progress Notes (Signed)
Cardiology Office Note   Date:  12/12/2014   ID:  Beverly Shepherd, DOB 10/04/1970, MRN [id number]  PCP:  Beverly Shepherd  Cardiologist:   Beverly Pates, MD   No chief complaint on file.     History of Present Illness: Beverly Shepherd is a 44 y.o. female with a history of  Palpitations:  She has had since 2010  Worse in 2012  If lays in bed gets SOB   Some occur as isolated skips  Others continuous and fast No syncope  SOme dizziness This past Saturday lasted all day   No definite trigger.  Has taken imitrex  ? If related   Went to [facility] on Saturday  Had EKG done Nothing found    When not having spells feels OK  Walks stairs.   Drinks 3 dr. Reino Kent per day One sweet tea.   Current Outpatient Prescriptions  Medication Sig Dispense Refill  . ALPRAZolam (XANAX) 1 MG tablet Take 1 mg by mouth daily.    . baclofen (LIORESAL) 10 MG tablet Take 10 mg by mouth daily as needed. As needed for  migraine    . dicyclomine (BENTYL) 20 MG tablet Take 30 mg by mouth every 6 (six) hours.    Marland Kitchen ibuprofen (ADVIL,MOTRIN) 600 MG tablet Take 600 mg by mouth every 6 (six) hours as needed.    . lithium carbonate 300 MG capsule Take 300 mg by mouth 3 (three) times daily with meals.    Marland Kitchen loratadine (CLARITIN REDITABS) 10 MG dissolvable tablet Take 10 mg by mouth daily.    . mirtazapine (REMERON) 15 MG tablet Take 15 mg by mouth at bedtime.    Marland Kitchen PARoxetine (PAXIL) 40 MG tablet Take 40 mg by mouth every morning.    . ranitidine (ZANTAC) 300 MG tablet Take 300 mg by mouth at bedtime.    . SUMAtriptan (IMITREX) 50 MG tablet Take 50 mg by mouth daily as needed. AS NEEDED FOR MIGRAINE     No current facility-administered medications for this visit.    Allergies:   Codeine phosphate; Duloxetine; Prednisone; and Sertraline   Past Medical History  Diagnosis Date  . Migraine   . IBS (irritable bowel syndrome)   . GERD (gastroesophageal reflux disease)   . Anxiety   . Insomnia   .  Palpitations     Past Surgical History  Procedure Laterality Date  . Abdominal hysterectomy       Social History:  The patient  reports that she has never smoked. She has never used smokeless tobacco. She reports that she drinks alcohol. She reports that she does not use illicit drugs.   Family History:  The patient's family history includes COPD in her father; Congestive Heart Failure in her father; Diabetes in her father; Healthy in her mother; Hypertension in her father.    ROS:  Please see the history of present illness. All other systems are reviewed and  Negative to the above problem except as noted.    PHYSICAL EXAM: VS:  BP 110/80 mmHg  Pulse 94  Ht  (1.626 m)  Wt 159 lb (72.122 kg)  BMI 27.28 kg/m2  GEN: Well nourished, well developed, in no acute distress HEENT: normal Neck: no JVD, carotid bruits, or masses Cardiac: RRR; no murmurs, rubs, or gallops,no edema  Respiratory:  clear to auscultation bilaterally, normal work of breathing GI: soft, nontender, nondistended, + BS  No hepatomegaly  MS: no deformity Moving all extremities   Skin:  warm and dry, no rash Neuro:  Strength and sensation are intact Psych: euthymic mood, full affect   EKG:  EKG is ordered today.  SR 94 bpm  Nonspecific ST  QTc approx 440     Lipid Panel No results found for: CHOL, TRIG, HDL, CHOLHDL, VLDL, LDLCALC, LDLDIRECT    Wt Readings from Last 3 Encounters:  12/12/14 159 lb (72.122 kg)  01/17/14 163 lb (73.936 kg)  06/15/13 163 lb (73.936 kg)      ASSESSMENT AND PLAN:  1.  Palpitations  Chronic problem  Does not appear to be hemodyn destabilizing  Never documented  History is difficult  I would set up for an echo as well as an event monitor  Cut back on caffeine.    2  Dyslpidemia  LDL is a little high at 128  Watch sat fat Trig are high at 301  I would recomm that she cut back on carbs.  Follow  INcrease activity    Signed, Beverly Pates, MD  12/12/2014 3:18 PM    [facility]  [facility] [address] [place], [place], [place]  [address] Phone: [phone]; Fax: [phone]

## 2014-12-12 NOTE — Patient Instructions (Signed)
Your physician recommends that you continue on your current medications as directed. Please refer to the Current Medication list given to you today. Your physician has recommended that you wear an event monitor. Event monitors are medical devices that record the heart's electrical activity. Doctors most often [place] these monitors to diagnose arrhythmias. Arrhythmias are problems with the speed or rhythm of the heartbeat. The monitor is a small, portable device. You can wear one while you do your normal daily activities. This is usually used to diagnose what is causing palpitations/syncope (passing out).  Your physician has requested that you have an echocardiogram. Echocardiography is a painless test that uses sound waves to create images of your heart. It provides your doctor with information about the size and shape of your heart and how well your heart's chambers and valves are working. This procedure takes approximately one hour. There are no restrictions for this procedure.  Follow up with your physician will depend on test results.

## 2014-12-14 ENCOUNTER — Institutional Professional Consult (permissible substitution): Payer: Self-pay | Admitting: Cardiology

## 2014-12-15 ENCOUNTER — Ambulatory Visit (HOSPITAL_COMMUNITY): Payer: BLUE CROSS/BLUE SHIELD | Attending: Cardiovascular Disease

## 2014-12-15 ENCOUNTER — Other Ambulatory Visit: Payer: Self-pay

## 2014-12-15 ENCOUNTER — Ambulatory Visit (INDEPENDENT_AMBULATORY_CARE_PROVIDER_SITE_OTHER): Payer: BLUE CROSS/BLUE SHIELD

## 2014-12-15 DIAGNOSIS — R002 Palpitations: Secondary | ICD-10-CM

## 2015-02-05 ENCOUNTER — Emergency Department (INDEPENDENT_AMBULATORY_CARE_PROVIDER_SITE_OTHER)
Admission: EM | Admit: 2015-02-05 | Discharge: 2015-02-05 | Disposition: A | Discharge status: Home / Self Care | Payer: BLUE CROSS/BLUE SHIELD | Source: Home / Self Care | Attending: Family Medicine | Admitting: Family Medicine

## 2015-02-05 ENCOUNTER — Encounter: Payer: Self-pay | Admitting: Emergency Medicine

## 2015-02-05 DIAGNOSIS — K1121 Acute sialoadenitis: Secondary | ICD-10-CM | POA: Diagnosis not present

## 2015-02-05 DIAGNOSIS — G43909 Migraine, unspecified, not intractable, without status migrainosus: Secondary | ICD-10-CM | POA: Diagnosis not present

## 2015-02-05 DIAGNOSIS — H6641 Suppurative otitis media, unspecified, right ear: Secondary | ICD-10-CM

## 2015-02-05 LAB — POCT CBC W AUTO DIFF (K'VILLE URGENT CARE)

## 2015-02-05 MED ORDER — KETOROLAC TROMETHAMINE 60 MG/2ML IM SOLN
60.0000 mg | Freq: Once | INTRAMUSCULAR | Status: AC
  Administered 2015-02-05: 60 mg via INTRAMUSCULAR

## 2015-02-05 MED ORDER — HYDROCODONE-ACETAMINOPHEN 5-325 MG PO TABS
1.0000 | ORAL_TABLET | Freq: Four times a day (QID) | ORAL | Status: DC | PRN
Start: 2015-02-05 — End: 2021-08-16

## 2015-02-05 MED ORDER — ONDANSETRON 4 MG PO TBDP
4.0000 mg | ORAL_TABLET | Freq: Once | ORAL | Status: AC
  Administered 2015-02-05: 4 mg via ORAL

## 2015-02-05 MED ORDER — AMOXICILLIN-POT CLAVULANATE 875-125 MG PO TABS: 1.0000 | ORAL_TABLET | Freq: Two times a day (BID) | ORAL | Status: DC

## 2015-02-05 NOTE — ED Notes (Signed)
Reports progressive worsening of pain in right ear area and resulting migraine headache over past 3 days; took imitrex this morning.

## 2015-02-05 NOTE — Discharge Instructions (Signed)
Apply warm compress 3 to 4 times daily.  May take Phenergan as needed for nausea.   Parotitis Parotitis is soreness and inflammation of one or both parotid glands. The parotid glands produce saliva. They are located on each side of the face, below and in front of the earlobes. The saliva produced comes out of tiny openings (ducts) inside the cheeks. In most cases, parotitis goes away over time or with treatment. If your parotitis is caused by certain long-term (chronic) diseases, it may come back again.  CAUSES  Parotitis can be caused by:  Viral infections. Mumps is one viral infection that can cause parotitis.  Bacterial infections.  Blockage of the salivary ducts due to a salivary stone.  Narrowing of the salivary ducts.  Swelling of the salivary ducts.  Dehydration.  Autoimmune conditions, such as sarcoidosis or Sjogren syndrome.  Air from activities such as scuba diving, glass blowing, or playing an instrument (rare).  Human immunodeficiency virus (HIV) or acquired immunodeficiency syndrome (AIDS).  Tuberculosis. SIGNS AND SYMPTOMS   The ears may appear to be pushed up and out from their normal position.  Redness (erythema) of the skin over the parotid glands.  Pain and tenderness over the parotid glands.  Swelling in the parotid gland area.  Yellowish-white fluid (pus) coming from the ducts inside the cheeks.  Dry mouth.  Bad taste in the mouth. DIAGNOSIS  Your health care provider may determine that you have parotitis based on your symptoms and a physical exam. A sample of fluid may also be taken from the parotid gland and tested to find the cause of your infection. X-rays or computed tomography (CT) scans may be taken if your health care provider thinks you might have a salivary stone blocking your salivary duct. TREATMENT  Treatment varies depending upon the cause of your parotitis. If your parotitis is caused by mumps, no treatment is needed. The condition will  go away on its own after 7 to 10 days. In other cases, treatment may include:  Antibiotic medicine if your infection was caused by bacteria.  Pain medicines.  Gland massage.  Eating sour candy to increase your saliva production.  Removal of salivary stones. Your health care provider may flush stones out with fluids or remove them with tweezers.  Surgery to remove the parotid glands. HOME CARE INSTRUCTIONS   If you were prescribed an antibiotic medicine, finish it all even if you start to feel better.  Put warm compresses on the sore area.  Take medicines only as directed by your health care provider.  Drink enough fluids to keep your urine clear or pale yellow. SEEK IMMEDIATE MEDICAL CARE IF:   You have increasing pain or swelling that is not controlled with medicine.  You have a fever. MAKE SURE YOU:  Understand these instructions.  Will watch your condition.  Will get help right away if you are not doing well or get worse.   This information is not intended to replace advice given to you by your health care provider. Make sure you discuss any questions you have with your health care provider.   Document Released: 08/17/2001 Document Revised: 03/18/2014 Document Reviewed: 07/21/2014 Elsevier Interactive Patient Education [employer]2016 Elsevier Inc.

## 2015-02-05 NOTE — ED Provider Notes (Signed)
CSN: [id number]     Arrival date & time 02/05/15  1340 History   First MD Initiated Contact with Patient 02/05/15 1506     Chief Complaint  Patient presents with  . Otalgia  . Headache      HPI Comments: Three days ago patient developed right earache and intermittent headache.  She has now developed soreness and swelling in front of her right ear.  No fevers, chills, and sweats.  The history is provided by the patient.    Past Medical History  Diagnosis Date  . Migraine   . IBS (irritable bowel syndrome)   . GERD (gastroesophageal reflux disease)   . Anxiety   . Insomnia   . Palpitations    Past Surgical History  Procedure Laterality Date  . Abdominal hysterectomy     Family History  Problem Relation Age of Onset  . Congestive Heart Failure Father   . Diabetes Father   . Hypertension Father   . COPD Father   . Healthy Mother    Social History  Substance Use Topics  . Smoking status: Never Smoker   . Smokeless tobacco: Never Used  . Alcohol Use: 0.0 oz/week    0 Standard drinks or equivalent per week     Comment: occasionally   OB History    No data available     Review of Systems  No sore throat No cough No pleuritic pain No wheezing No nasal congestion No post-nasal drainage No sinus pain/pressure No itchy/red eyes + right earache No hemoptysis No SOB No fever/chills No nausea No vomiting No abdominal pain No diarrhea No urinary symptoms No skin rash + fatigue No myalgias + headache Used OTC meds without relief   Allergies  Codeine phosphate; Duloxetine; Prednisone; and Sertraline  Home Medications   Prior to Admission medications   Medication Sig Start Date End Date Taking? Authorizing Provider  ALPRAZolam Prudy Feeler(XANAX) 1 MG tablet Take 1 mg by mouth daily. 09/22/14   Historical Provider, MD  amoxicillin-clavulanate (AUGMENTIN) 875-125 MG tablet Take 1 tablet by mouth 2 (two) times daily. Take with food 02/05/15   Lattie HawStephen A Tessla Spurling, MD    baclofen (LIORESAL) 10 MG tablet Take 10 mg by mouth daily as needed. As needed for  migraine 04/20/14   Historical Provider, MD  dicyclomine (BENTYL) 20 MG tablet Take 30 mg by mouth every 6 (six) hours.    Historical Provider, MD  HYDROcodone-acetaminophen (NORCO/VICODIN) 5-325 MG tablet Take 1 tablet by mouth every 6 (six) hours as needed for moderate pain. 02/05/15   Lattie HawStephen A Ajanae Virag, MD  ibuprofen (ADVIL,MOTRIN) 600 MG tablet Take 600 mg by mouth every 6 (six) hours as needed.    Historical Provider, MD  lithium carbonate 300 MG capsule Take 300 mg by mouth 3 (three) times daily with meals.    Historical Provider, MD  loratadine (CLARITIN REDITABS) 10 MG dissolvable tablet Take 10 mg by mouth daily.    Historical Provider, MD  mirtazapine (REMERON) 15 MG tablet Take 15 mg by mouth at bedtime.    Historical Provider, MD  PARoxetine (PAXIL) 40 MG tablet Take 40 mg by mouth every morning.    Historical Provider, MD  ranitidine (ZANTAC) 300 MG tablet Take 300 mg by mouth at bedtime.    Historical Provider, MD  SUMAtriptan (IMITREX) 50 MG tablet Take 50 mg by mouth daily as needed. AS NEEDED FOR MIGRAINE    Historical Provider, MD   Meds Ordered and Administered this Visit   Medications  ondansetron (ZOFRAN-ODT) disintegrating tablet 4 mg (4 mg Oral Given 02/05/15 1527)  ketorolac (TORADOL) injection 60 mg (60 mg Intramuscular Given 02/05/15 1535)    BP 117/77 mmHg  Pulse 106  Temp(Src) 98.4 F (36.9 C) (Oral)  Resp 16  Ht  (1.626 m)  Wt 155 lb (70.308 kg)  BMI 26.59 kg/m2  SpO2 98% No data found.   Physical Exam  Constitutional: She is oriented to person, place, and time. She appears well-developed and well-nourished. No distress.  HENT:  Head:    Right Ear: External ear and ear canal normal. Tympanic membrane is injected and erythematous.  Left Ear: Tympanic membrane, external ear and ear canal normal.  Nose: Nose normal.  Mouth/Throat: Oropharynx is clear and moist.   Distinct tenderness to palpation and mild swelling over right parotid gland. Right tympanic membrane is erythematous with decreased landmarks  Eyes: Conjunctivae are normal. Pupils are equal, round, and reactive to light.  Neck: Neck supple.  Cardiovascular: Normal heart sounds.   Pulmonary/Chest: Breath sounds normal.  Abdominal: Bowel sounds are normal. There is no tenderness.  Musculoskeletal: She exhibits no edema.  Lymphadenopathy:    She has no cervical adenopathy.  Neurological: She is alert and oriented to person, place, and time.  Skin: Skin is warm and dry. No erythema.  Nursing note and vitals reviewed.   ED Course  Procedures  None    Labs Reviewed  POCT CBC W AUTO DIFF ([facility]):  WBC 10.9; LY 24.9; MO 5.8; GR 69.3; Hgb 12.9; Platelets 254       MDM   1. Other suppurative otitis media of right ear   2. Parotitis, acute   3. Migraine without status migrainosus, not intractable, unspecified migraine type    Administered Zofran ODT  po; Toradol  IM Rx for Augmentin  BID Apply warm compress 3 to 4 times daily.  May take Phenergan as needed for nausea.  Suck on sour candies.  Followup with ENT if not improved about 4 days.    Lattie Haw, MD 02/15/15 [phone]

## 2015-07-23 ENCOUNTER — Encounter (HOSPITAL_BASED_OUTPATIENT_CLINIC_OR_DEPARTMENT_OTHER): Payer: Self-pay | Admitting: *Deleted

## 2015-07-23 ENCOUNTER — Inpatient Hospital Stay (HOSPITAL_BASED_OUTPATIENT_CLINIC_OR_DEPARTMENT_OTHER)
Admission: EM | Admit: 2015-07-23 | Discharge: 2015-07-24 | DRG: 390 | Disposition: A | Discharge status: Home / Self Care | Payer: BLUE CROSS/BLUE SHIELD | Attending: Surgery | Admitting: Surgery

## 2015-07-23 ENCOUNTER — Emergency Department (HOSPITAL_BASED_OUTPATIENT_CLINIC_OR_DEPARTMENT_OTHER): Payer: BLUE CROSS/BLUE SHIELD

## 2015-07-23 DIAGNOSIS — G43909 Migraine, unspecified, not intractable, without status migrainosus: Secondary | ICD-10-CM | POA: Diagnosis present

## 2015-07-23 DIAGNOSIS — F329 Major depressive disorder, single episode, unspecified: Secondary | ICD-10-CM | POA: Diagnosis present

## 2015-07-23 DIAGNOSIS — Z7982 Long term (current) use of aspirin: Secondary | ICD-10-CM | POA: Diagnosis not present

## 2015-07-23 DIAGNOSIS — Z888 Allergy status to other drugs, medicaments and biological substances status: Secondary | ICD-10-CM

## 2015-07-23 DIAGNOSIS — K219 Gastro-esophageal reflux disease without esophagitis: Secondary | ICD-10-CM | POA: Diagnosis present

## 2015-07-23 DIAGNOSIS — G4700 Insomnia, unspecified: Secondary | ICD-10-CM | POA: Diagnosis present

## 2015-07-23 DIAGNOSIS — R112 Nausea with vomiting, unspecified: Secondary | ICD-10-CM | POA: Diagnosis present

## 2015-07-23 DIAGNOSIS — Z79899 Other long term (current) drug therapy: Secondary | ICD-10-CM | POA: Diagnosis not present

## 2015-07-23 DIAGNOSIS — K589 Irritable bowel syndrome without diarrhea: Secondary | ICD-10-CM | POA: Diagnosis present

## 2015-07-23 DIAGNOSIS — F419 Anxiety disorder, unspecified: Secondary | ICD-10-CM | POA: Diagnosis present

## 2015-07-23 DIAGNOSIS — K5660 Unspecified intestinal obstruction: Principal | ICD-10-CM | POA: Diagnosis present

## 2015-07-23 DIAGNOSIS — Z885 Allergy status to narcotic agent status: Secondary | ICD-10-CM

## 2015-07-23 DIAGNOSIS — K56609 Unspecified intestinal obstruction, unspecified as to partial versus complete obstruction: Secondary | ICD-10-CM

## 2015-07-23 LAB — COMPREHENSIVE METABOLIC PANEL
ALT: 18 U/L (ref 14–54)
AST: 30 U/L (ref 15–41)
Albumin: 4.5 g/dL (ref 3.5–5.0)
Alkaline Phosphatase: 83 U/L (ref 38–126)
Anion gap: 15 (ref 5–15)
BUN: 9 mg/dL (ref 6–20)
CO2: 19 mmol/L — ABNORMAL LOW (ref 22–32)
Calcium: 9.6 mg/dL (ref 8.9–10.3)
Chloride: 103 mmol/L (ref 101–111)
Creatinine, Ser: 0.79 mg/dL (ref 0.44–1.00)
GFR calc Af Amer: >60 mL/min (ref >60)
GFR calc non Af Amer: >60 mL/min (ref >60)
Glucose, Bld: 146 mg/dL — ABNORMAL HIGH (ref 65–99)
Potassium: 3.4 mmol/L — ABNORMAL LOW (ref 3.5–5.1)
Sodium: 137 mmol/L (ref 135–145)
Total Bilirubin: 0.6 mg/dL (ref 0.3–1.2)
Total Protein: 7.6 g/dL (ref 6.5–8.1)

## 2015-07-23 LAB — CBC WITH DIFFERENTIAL/PLATELET
Basophils Absolute: 0 10*3/uL (ref 0.0–0.1)
Basophils Relative: 0 %
Eosinophils Absolute: 0 10*3/uL (ref 0.0–0.7)
Eosinophils Relative: 0 %
HCT: 39.9 % (ref 36.0–46.0)
Hemoglobin: 13.9 g/dL (ref 12.0–15.0)
Lymphocytes Relative: 10 %
Lymphs Abs: 1.2 10*3/uL (ref 0.7–4.0)
MCH: 31.2 pg (ref 26.0–34.0)
MCHC: 34.8 g/dL (ref 30.0–36.0)
MCV: 89.5 fL (ref 78.0–100.0)
Monocytes Absolute: 0.4 10*3/uL (ref 0.1–1.0)
Monocytes Relative: 3 %
Neutro Abs: 10.6 10*3/uL — ABNORMAL HIGH (ref 1.7–7.7)
Neutrophils Relative %: 87 %
Platelets: 321 10*3/uL (ref 150–400)
RBC: 4.46 MIL/uL (ref 3.87–5.11)
RDW: 13.1 % (ref 11.5–15.5)
WBC: 12.2 10*3/uL — ABNORMAL HIGH (ref 4.0–10.5)

## 2015-07-23 LAB — URINALYSIS, ROUTINE W REFLEX MICROSCOPIC
Bilirubin Urine: NEGATIVE
Glucose, UA: NEGATIVE mg/dL
Hgb urine dipstick: NEGATIVE
Ketones, ur: >80 mg/dL — AB
Leukocytes, UA: NEGATIVE
Nitrite: NEGATIVE
Protein, ur: NEGATIVE mg/dL
Specific Gravity, Urine: 1.019 (ref 1.005–1.030)
pH: 8.5 — ABNORMAL HIGH (ref 5.0–8.0)

## 2015-07-23 LAB — LIPASE, BLOOD: Lipase: 17 U/L (ref 11–51)

## 2015-07-23 LAB — PREGNANCY, URINE: Preg Test, Ur: NEGATIVE

## 2015-07-23 MED ORDER — LIDOCAINE HCL 2 % EX GEL
1.0000 "application " | Freq: Once | CUTANEOUS | Status: AC
  Administered 2015-07-23: 1 via URETHRAL
  Filled 2015-07-23: qty 20

## 2015-07-23 MED ORDER — PROCHLORPERAZINE EDISYLATE 5 MG/ML IJ SOLN
10.0000 mg | Freq: Once | INTRAMUSCULAR | Status: AC
  Administered 2015-07-23: 10 mg via INTRAVENOUS
  Filled 2015-07-23: qty 2

## 2015-07-23 MED ORDER — IOPAMIDOL (ISOVUE-300) INJECTION 61%
100.0000 mL | Freq: Once | INTRAVENOUS | Status: AC | PRN
  Administered 2015-07-23: 100 mL via INTRAVENOUS

## 2015-07-23 MED ORDER — HYDROMORPHONE HCL 1 MG/ML IJ SOLN
1.0000 mg | Freq: Once | INTRAMUSCULAR | Status: AC
  Administered 2015-07-23: 1 mg via INTRAVENOUS
  Filled 2015-07-23: qty 1

## 2015-07-23 MED ORDER — MORPHINE SULFATE (PF) 4 MG/ML IV SOLN
4.0000 mg | Freq: Once | INTRAVENOUS | Status: AC
  Administered 2015-07-23: 4 mg via INTRAVENOUS
  Filled 2015-07-23: qty 1

## 2015-07-23 MED ORDER — ONDANSETRON 4 MG PO TBDP
4.0000 mg | ORAL_TABLET | Freq: Once | ORAL | Status: AC
  Administered 2015-07-23: 4 mg via ORAL
  Filled 2015-07-23: qty 1

## 2015-07-23 MED ORDER — KCL IN DEXTROSE-NACL 20-5-0.45 MEQ/L-%-% IV SOLN
INTRAVENOUS | Status: DC
  Administered 2015-07-23 – 2015-07-24 (×2): via INTRAVENOUS
  Filled 2015-07-23 (×3): qty 1000

## 2015-07-23 MED ORDER — ONDANSETRON HCL 4 MG/2ML IJ SOLN: 4.0000 mg | Freq: Four times a day (QID) | INTRAMUSCULAR | Status: DC | PRN

## 2015-07-23 MED ORDER — ONDANSETRON 4 MG PO TBDP: 4.0000 mg | ORAL_TABLET | Freq: Four times a day (QID) | ORAL | Status: DC | PRN

## 2015-07-23 MED ORDER — DIATRIZOATE MEGLUMINE & SODIUM 66-10 % PO SOLN
90.0000 mL | Freq: Once | ORAL | Status: AC
  Administered 2015-07-23: 90 mL via NASOGASTRIC
  Filled 2015-07-23: qty 90

## 2015-07-23 MED ORDER — GI COCKTAIL ~~LOC~~
30.0000 mL | Freq: Once | ORAL | Status: AC
  Administered 2015-07-23: 30 mL via ORAL
  Filled 2015-07-23: qty 30

## 2015-07-23 MED ORDER — SODIUM CHLORIDE 0.9 % IV BOLUS (SEPSIS)
1000.0000 mL | Freq: Once | INTRAVENOUS | Status: AC
Start: 2015-07-23 — End: 2015-07-23
  Administered 2015-07-23: 1000 mL via INTRAVENOUS

## 2015-07-23 MED ORDER — SODIUM CHLORIDE 0.9 % IV SOLN
Freq: Once | INTRAVENOUS | Status: AC
  Administered 2015-07-23: 19:00:00 via INTRAVENOUS

## 2015-07-23 MED ORDER — HEPARIN SODIUM (PORCINE) 5000 UNIT/ML IJ SOLN
5000.0000 [IU] | Freq: Three times a day (TID) | INTRAMUSCULAR | Status: DC
  Administered 2015-07-23 – 2015-07-24 (×2): 5000 [IU] via SUBCUTANEOUS
  Filled 2015-07-23 (×2): qty 1

## 2015-07-23 MED ORDER — METOCLOPRAMIDE HCL 5 MG/ML IJ SOLN
10.0000 mg | Freq: Once | INTRAMUSCULAR | Status: AC
  Administered 2015-07-23: 10 mg via INTRAVENOUS
  Filled 2015-07-23: qty 2

## 2015-07-23 MED ORDER — PROMETHAZINE HCL 25 MG PO TABS
25.0000 mg | ORAL_TABLET | Freq: Once | ORAL | Status: AC
  Administered 2015-07-23: 25 mg via ORAL
  Filled 2015-07-23: qty 1

## 2015-07-23 MED ORDER — LORAZEPAM 2 MG/ML IJ SOLN
0.5000 mg | Freq: Once | INTRAMUSCULAR | Status: AC
  Administered 2015-07-23: 0.5 mg via INTRAVENOUS
  Filled 2015-07-23: qty 1

## 2015-07-23 MED ORDER — ONDANSETRON HCL 4 MG/2ML IJ SOLN
INTRAMUSCULAR | Status: AC
  Administered 2015-07-23: 4 mg
  Filled 2015-07-23: qty 2

## 2015-07-23 MED ORDER — MORPHINE SULFATE (PF) 2 MG/ML IV SOLN: 1.0000 mg | INTRAVENOUS | Status: DC | PRN

## 2015-07-23 NOTE — ED Notes (Signed)
MD at bedside. 

## 2015-07-23 NOTE — ED Notes (Signed)
PA notified of continued nausea. PA at bedside at this time.

## 2015-07-23 NOTE — ED Provider Notes (Signed)
Pt transferred from [facility] for bowel obstruction.  Pt reports feeling improved since initial ED presentation.  NG tube in place with distended abdomen, mild to moderate tenderness.  Pt anxious in ED, missed her daily anxiety meds - providing IV ativan.  Dr. Ezzard StandingNewman with General Surgery notified of patient's ED arrival.    Beverly FossaElizabeth Elda Dunkerson, MD 07/24/15 [phone]

## 2015-07-23 NOTE — ED Notes (Signed)
Pt placed on auto vitals Q30.  

## 2015-07-23 NOTE — ED Provider Notes (Signed)
CSN: [id number]     Arrival date & time 07/23/15  1141 History   First MD Initiated Contact with Patient 07/23/15 1152     Chief Complaint  Patient presents with  . Abdominal Pain   HPI   45 year old female presents today with complaints of nausea and abdominal pain. Patient reports a significant past medical history of IBS. She notes that her last bowel movement was approximate 48 hours ago, which is normal for her. She reports last night she started developing nausea, and epigastric abdominal pain. She reports that she feels like she needs to throw up, but is unable to. Patient reports she's been able to drink small amounts of water since onset. Patient denies any lower abdominal pain, fever. She denies any exposure to abnormal food or drink. Patient reports that she has a history of indigestion, currently taking Zantac daily. Patient denies any current reflux symptoms.  Past Medical History  Diagnosis Date  . Migraine   . IBS (irritable bowel syndrome)   . GERD (gastroesophageal reflux disease)   . Anxiety   . Insomnia   . Palpitations    Past Surgical History  Procedure Laterality Date  . Abdominal hysterectomy     Family History  Problem Relation Age of Onset  . Congestive Heart Failure Father   . Diabetes Father   . Hypertension Father   . COPD Father   . Healthy Mother    Social History  Substance Use Topics  . Smoking status: Never Smoker   . Smokeless tobacco: Never Used  . Alcohol Use: 0.0 oz/week    0 Standard drinks or equivalent per week     Comment: occasionally   OB History    No data available     Review of Systems  All other systems reviewed and are negative.   Allergies  Codeine phosphate; Duloxetine; Prednisone; and Sertraline  Home Medications   Prior to Admission medications   Medication Sig Start Date End Date Taking? Authorizing Provider  ALPRAZolam Prudy Feeler) 1 MG tablet Take 1 mg by mouth daily. 09/22/14   Historical Provider, MD   amoxicillin-clavulanate (AUGMENTIN) 875-125 MG tablet Take 1 tablet by mouth 2 (two) times daily. Take with food 02/05/15   Lattie Haw, MD  baclofen (LIORESAL) 10 MG tablet Take 10 mg by mouth daily as needed. As needed for  migraine 04/20/14   Historical Provider, MD  dicyclomine (BENTYL) 20 MG tablet Take 30 mg by mouth every 6 (six) hours.    Historical Provider, MD  HYDROcodone-acetaminophen (NORCO/VICODIN) 5-325 MG tablet Take 1 tablet by mouth every 6 (six) hours as needed for moderate pain. 02/05/15   Lattie Haw, MD  ibuprofen (ADVIL,MOTRIN) 600 MG tablet Take 600 mg by mouth every 6 (six) hours as needed.    Historical Provider, MD  lithium carbonate 300 MG capsule Take 300 mg by mouth daily.     Historical Provider, MD  loratadine (CLARITIN REDITABS) 10 MG dissolvable tablet Take 10 mg by mouth daily.    Historical Provider, MD  mirtazapine (REMERON) 15 MG tablet Take 15 mg by mouth at bedtime.    Historical Provider, MD  PARoxetine (PAXIL) 40 MG tablet Take 40 mg by mouth every morning.    Historical Provider, MD  ranitidine (ZANTAC) 300 MG tablet Take 300 mg by mouth at bedtime.    Historical Provider, MD  SUMAtriptan (IMITREX) 50 MG tablet Take 50 mg by mouth daily as needed. AS NEEDED FOR MIGRAINE    Historical  Provider, MD   BP 114/77 mmHg  Pulse 129  Temp(Src) 99.3 F (37.4 C) (Oral)  Resp 20  Ht  (1.626 m)  Wt 68.04 kg  BMI 25.73 kg/m2  SpO2 96%   Physical Exam  Constitutional: She is oriented to person, place, and time. She appears well-developed and well-nourished.  HENT:  Head: Normocephalic and atraumatic.  Eyes: Conjunctivae are normal. Pupils are equal, round, and reactive to light. Right eye exhibits no discharge. Left eye exhibits no discharge. No scleral icterus.  Neck: Normal range of motion. No JVD present. No tracheal deviation present.  Pulmonary/Chest: Effort normal. No stridor.  Abdominal: Soft. She exhibits no distension and no mass. There  is tenderness. There is no rebound and no guarding.  Tenderness to palpation of the epigastric region, no rebound or guarding  Neurological: She is alert and oriented to person, place, and time. Coordination normal.  Skin: Skin is warm and dry. No rash noted. No erythema. No pallor.  Psychiatric: She has a normal mood and affect. Her behavior is normal. Judgment and thought content normal.  Nursing note and vitals reviewed.   ED Course  Procedures (including critical care time) Labs Review Labs Reviewed  CBC WITH DIFFERENTIAL/PLATELET - Abnormal; Notable for the following:    WBC 12.2 (*)    Neutro Abs 10.6 (*)    All other components within normal limits  COMPREHENSIVE METABOLIC PANEL - Abnormal; Notable for the following:    Potassium 3.4 (*)    CO2 19 (*)    Glucose, Bld 146 (*)    All other components within normal limits  URINALYSIS, ROUTINE W REFLEX MICROSCOPIC (NOT AT [facility]) - Abnormal; Notable for the following:    APPearance CLOUDY (*)    pH 8.5 (*)    Ketones, ur >80 (*)    All other components within normal limits  LIPASE, BLOOD  PREGNANCY, URINE    Imaging Review Ct Abdomen Pelvis W Contrast  07/23/2015  CLINICAL DATA:  Nausea and vomiting. Abdominal pain and distention over the last 14 hours. EXAM: CT ABDOMEN AND PELVIS WITH CONTRAST TECHNIQUE: Multidetector CT imaging of the abdomen and pelvis was performed using the standard protocol following bolus administration of intravenous contrast. CONTRAST:  ISOVUE-300 IOPAMIDOL (ISOVUE-300) INJECTION 61% COMPARISON:  06/04/2004 FINDINGS: Lung bases are clear. No pleural or pericardial fluid. The liver appears normal. No calcified gallstones. The spleen is normal. The pancreas is normal. The adrenal glands are normal. The kidneys are normal. The aorta and IVC are normal. There is been previous hysterectomy. There is a small bowel obstruction pattern with markedly dilated fluid and air-filled small intestine involving the  jejunum. The ileum is decompressed. Specific etiology of the obstruction is not identified. 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 IMPRESSION: Small bowel obstruction, moderate to high grade, near the jejunoileal junction. Specific etiology indeterminate. No free fluid or air. Electronically Signed   By: Paulina Fusi M.D.   On: 07/23/2015 15:56   Dg Abd Acute W/chest  07/23/2015  CLINICAL DATA:  Epigastric pain and abdominal pain starting last night, initial encounter nausea and vomiting EXAM: DG ABDOMEN ACUTE W/ 1V CHEST COMPARISON:  None. FINDINGS: Cardiomediastinal silhouette is unremarkable. No acute infiltrate or pleural effusion. No pulmonary edema. Minimal gaseous distended bowel loops in mid upper abdomen. Ileus or enteritis cannot be excluded. No free abdominal air. IMPRESSION: No acute disease within chest. Minimal gaseous distended bowel loops in mid upper abdomen. Ileus or enteritis cannot be  excluded. Electronically Signed   By: Natasha MeadLiviu  Pop M.D.   On: 07/23/2015 14:35   I have personally reviewed and evaluated these images and lab results as part of my medical decision-making.   EKG Interpretation None      MDM   Final diagnoses:  SBO (small bowel obstruction) (HCC)    Labs: Urinalysis, urine prior, CBC, CMP, lipase  Imaging:  Consults:  Therapeutics: Zofran, GI cocktail  Discharge Meds:   Assessment/Plan: 45 year old female presents today with complaints of abdominal pain. Patient has pain located in the epigastric region, she has no fever, vomiting. Initial attempts at nausea control were not productive, patient used 4 mg of Zofran prior to arrival, received 4 mg of Zofran IV, and 10 mg of Compazine. Patient continued to have pain, was given 4 mg of morphine here in the ED. No improvement in her symptoms, patient continues to be nauseous.  DG acute abdomen shows no active disease within the chest, minimally gases distended bowel loops in mid upper  abdomen ileus or enteritis cannot be excluded. Patient continues to endorse significant abdominal discomfort, CT and urinalysis indicated at this time.  CT scan shows small bowel obstruction, moderate to high-grade you the jejunoileal junction. Gen. surgery was consult and who [facility]. I spoke with Dr. Effie ShyWentz who was aware that the patient would be transferred. Patient's vital signs have remained stable while here in the ED, she continues to have pain and nausea. NG tube placed, [facility].         Eyvonne MechanicJeffrey Kelly Eisler, PA-C 07/23/15 1714  Glynn OctaveStephen Rancour, MD 07/23/15 [phone]

## 2015-07-23 NOTE — ED Notes (Signed)
Patient c/o abd pain since around 2 AM, nausea, no diarrhea, Hx IBS

## 2015-07-23 NOTE — ED Notes (Signed)
Pt sitting in the floor in front of the trash can rocking back and forth.  Refuses to use an emesis bag "because I have to hold it."

## 2015-07-23 NOTE — ED Notes (Signed)
Pt reports she took 2 Zofran (4mg  each) during the night and 1 this morning. Pt denies vomiting, but reports dry heaving.

## 2015-07-23 NOTE — ED Notes (Signed)
Bed: WA17 Expected date:  Expected time:  Means of arrival:  Comments: [facility]

## 2015-07-23 NOTE — H&P (Signed)
Re:   Beverly Shepherd DOB:   10-13-1970 MRN:   [id number]   [facility] Admission  ASSESSMENT AND PLAN: 1.  SBO  Plan:  NPO, IVF, repeat labs and exam in AM  2.  Migraine headaches 3.  IBS (irritable bowel syndrome)D 4.  Depression/Anxiety  Chief Complaint  Patient presents with  . Abdominal Pain   REFERRING PHYSICIAN: GURLEY,Scott, PA-C  HISTORY OF PRESENT ILLNESS: Beverly Shepherd is a 45 y.o. (DOB: 02/09/1971)  white  female whose primary care physician is GURLEY,Scott, PA-C.  She came to [facility] this AM by ambulance because of nausea and vomiting. Boyfriend, Armando Reichert, in room with pt.  The nausea and vomiting started around 11 PM Saturday night.  She's had no prior bowel obstruction.  Her only prior abdominal surgery was by Dr. Valentina Shaggy a lap assisted vaginal hysterectomy 05/2005. She does suffer from IBS, takes Bentyl on a regular basis, has seen a GI doctor in the past, but it has been years.  She'll go a week without a bowel movement at times and sometimes she'll have frequent loose stools daily. She has no stomach, liver, or colon disease.  CT scan of abdomen - 07/23/2015 - Small bowel obstruction, moderate to high grade, near the jejunoileal junction. WBC - 12,200 - 07/23/2015   Past Medical History  Diagnosis Date  . Migraine   . IBS (irritable bowel syndrome)   . GERD (gastroesophageal reflux disease)   . Anxiety   . Insomnia   . Palpitations       Past Surgical History  Procedure Laterality Date  . Abdominal hysterectomy        No current facility-administered medications for this encounter.   Current Outpatient Prescriptions  Medication Sig Dispense Refill  . ALPRAZolam (XANAX) 1 MG tablet Take 1 mg by mouth daily.    Marland Kitchen aspirin EC 81 MG tablet Take 81 mg by mouth daily.    Marland Kitchen dicyclomine (BENTYL) 20 MG tablet Take 20 mg by mouth every 4 (four) hours as needed for spasms.     Marland Kitchen ibuprofen (ADVIL,MOTRIN) 200 MG tablet Take 800 mg by mouth  daily as needed for headache or moderate pain.    Marland Kitchen lithium carbonate 300 MG capsule Take 300 mg by mouth daily.     Marland Kitchen loratadine (CLARITIN) 10 MG tablet Take 10 mg by mouth daily.    . mirtazapine (REMERON) 15 MG tablet Take 15 mg by mouth at bedtime.    . ondansetron (ZOFRAN-ODT) 4 MG disintegrating tablet Take 4 mg by mouth every 6 (six) hours as needed for nausea or vomiting.   0  . PARoxetine (PAXIL) 20 MG tablet Take 50 mg by mouth daily.    . ranitidine (ZANTAC) 300 MG tablet Take 300 mg by mouth at bedtime.    . SUMAtriptan (IMITREX) 100 MG tablet Take 100 mg by mouth 2 (two) times daily as needed for migraine.   12  . valACYclovir (VALTREX) 1000 MG tablet Take 500 mg by mouth daily.  9  . amoxicillin-clavulanate (AUGMENTIN) 875-125 MG tablet Take 1 tablet by mouth 2 (two) times daily. Take with food 20 tablet 0  . baclofen (LIORESAL) 10 MG tablet Take 10 mg by mouth daily as needed. As needed for  migraine    . HYDROcodone-acetaminophen (NORCO/VICODIN) 5-325 MG tablet Take 1 tablet by mouth every 6 (six) hours as needed for moderate pain. 15 tablet 0      Allergies  Allergen Reactions  .  Codeine Phosphate [Codeine]     #5 only, high doses cause itching all over  . Duloxetine     unknown  . Prednisone     Heart palpitations   . Sertraline     Unknown reaction    REVIEW OF SYSTEMS: Skin:  No history of rash.  No history of abnormal moles. Infection:  No history of hepatitis or HIV.  No history of MRSA. Neurologic:  Has migraine headaches - about 3 per week.  Has seen a neurologist remotely. Cardiac:  No history of hypertension. She saw Dr. Lovina ReachP. Ross for palpitations in 12/12/2014.. Pulmonary:  Does not smoke cigarettes.  No asthma or bronchitis.  No OSA/CPAP.  Endocrine:  No diabetes. No thyroid disease. Gastrointestinal:  See HPI Urologic:  No history of kidney stones.  No history of bladder infections. Musculoskeletal:  No history of joint or back disease. Hematologic:  No  bleeding disorder.  No history of anemia.  Not anticoagulated. Psycho-social:  [facility]ression  SOCIAL and FAMILY HISTORY: Boyfriend, Armando Reichertndrew Finnerty, in room with pt. 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 She has son, 45 yo, and daughter, 45 yo.  PHYSICAL EXAM: BP 114/76 mmHg  Pulse 120  Temp(Src) 98.2 F (36.8 C) (Oral)  Resp 18  Ht 5\' 4"  (1.626 m)  Wt 68.04 kg (150 lb)  BMI 25.73 kg/m2  SpO2 95%  General: WN WF who is alert and generally healthy appearing.  HEENT: Normal. Pupils equal. Neck: Supple. No mass.  No thyroid mass. Lymph Nodes:  No supraclavicular or cervical nodes. Lungs: Clear to auscultation and symmetric breath sounds. Heart:  RRR. No murmur or rub. Abdomen: Mildly distended.  Sore in mid abdomen, but no peritoneal signs  Extremities:  Good strength and ROM  in upper and lower extremities. Neurologic:  Grossly intact to motor and sensory function. Psychiatric: Has normal mood and affect. Behavior is normal.   DATA REVIEWED: Epic notes  Ovidio Kinavid Rafal Archuleta, MD,  [facility] [address]  [address]   [place], [place]    [id number] Phone:  [phone] FAX:  [phone]

## 2015-07-24 ENCOUNTER — Inpatient Hospital Stay (HOSPITAL_COMMUNITY): Payer: BLUE CROSS/BLUE SHIELD

## 2015-07-24 LAB — BASIC METABOLIC PANEL
Anion gap: 4 — ABNORMAL LOW (ref 5–15)
BUN: 10 mg/dL (ref 6–20)
CHLORIDE: 112 mmol/L — AB (ref 101–111)
CO2: 25 mmol/L (ref 22–32)
CREATININE: 0.68 mg/dL (ref 0.44–1.00)
Calcium: 7.7 mg/dL — ABNORMAL LOW (ref 8.9–10.3)
GFR calc non Af Amer: >60 mL/min (ref >60)
Glucose, Bld: 138 mg/dL — ABNORMAL HIGH (ref 65–99)
POTASSIUM: 4 mmol/L (ref 3.5–5.1)
SODIUM: 141 mmol/L (ref 135–145)

## 2015-07-24 LAB — CBC
HEMATOCRIT: 34 % — AB (ref 36.0–46.0)
Hemoglobin: 11.4 g/dL — ABNORMAL LOW (ref 12.0–15.0)
MCH: 31 pg (ref 26.0–34.0)
MCHC: 33.5 g/dL (ref 30.0–36.0)
MCV: 92.4 fL (ref 78.0–100.0)
Platelets: 251 10*3/uL (ref 150–400)
RBC: 3.68 MIL/uL — AB (ref 3.87–5.11)
RDW: 13.6 % (ref 11.5–15.5)
WBC: 9.9 10*3/uL (ref 4.0–10.5)

## 2015-07-24 MED ORDER — METOCLOPRAMIDE HCL 10 MG PO TABS: 10.0000 mg | ORAL_TABLET | Freq: Four times a day (QID) | ORAL | Status: DC | PRN

## 2015-07-24 MED ORDER — SUMATRIPTAN SUCCINATE 100 MG PO TABS
100.0000 mg | ORAL_TABLET | ORAL | Status: DC | PRN
  Administered 2015-07-24: 100 mg via ORAL
  Filled 2015-07-24 (×2): qty 1

## 2015-07-24 MED ORDER — HYDROCODONE-ACETAMINOPHEN 5-325 MG PO TABS
2.0000 | ORAL_TABLET | Freq: Once | ORAL | Status: AC
  Administered 2015-07-24: 2 via ORAL
  Filled 2015-07-24: qty 2

## 2015-07-24 NOTE — Discharge Summary (Signed)
Physician Discharge Summary  Beverly Shepherd ZOX:[id number] DOB: 1971/02/02 DOA: 07/23/2015  PCP: Delphia GratesGURLEY,Scott, PA-C  Consultation: none   Admit date: 07/23/2015 Discharge date: 07/24/2015  Recommendations for Outpatient Follow-up:   Follow-up Information    Follow up with GURLEY,Scott, PA-C In 2 weeks.   Specialty:  Physician Assistant   Why:  hospital follow up    Contact information:   [address] [place] [address] [phone]      Discharge Diagnoses:  1. Small bowel obstruction   Surgical Procedure: none  Discharge Condition: stable Disposition: home  Diet recommendation: regular   Filed Weights   07/23/15 1141 07/23/15 1142 07/23/15 2055  Weight: 68.04 kg (150 lb) 68.04 kg (150 lb) 68.675 kg (151 lb 6.4 oz)    Filed Vitals:   07/24/15 0537 07/24/15 1000  BP: 91/53 108/68  Pulse: 86 76  Temp: 98.6 F (37 C) 98.7 F (37.1 C)  Resp: 16 16      Hospital Course:  Beverly Shepherd is a 45 year old female with a history of abdominal hysterectomy, IBS, migraine headaches who [facility] with abdominal pain.  She was found to have a bowel obstruction and [facility].  She had an NGT placed, bowel rest, follow up films showed contrast in the colon.  NGT was removed and she was given fulls which she tolerated well.  She also had bowel function.  She was therefore felt stable for discharge home.  Warning signs that warrant further evaluation discussed.  She was encouraged to follow up with her PCP.   Discharge Instructions     Medication List    STOP taking these medications        amoxicillin-clavulanate 875-125 MG tablet  Commonly known as:  AUGMENTIN     dicyclomine 20 MG tablet  Commonly known as:  BENTYL      TAKE these medications        ALPRAZolam 1 MG tablet  Commonly known as:  XANAX  Take 1 mg by mouth daily.     aspirin EC 81 MG tablet  Take 81 mg by mouth daily.     baclofen 10 MG tablet  Commonly  known as:  LIORESAL  Take 10 mg by mouth daily as needed. As needed for  migraine     HYDROcodone-acetaminophen 5-325 MG tablet  Commonly known as:  NORCO/VICODIN  Take 1 tablet by mouth every 6 (six) hours as needed for moderate pain.     ibuprofen 200 MG tablet  Commonly known as:  ADVIL,MOTRIN  Take 800 mg by mouth daily as needed for headache or moderate pain.     lithium carbonate 300 MG capsule  Take 300 mg by mouth daily.     loratadine 10 MG tablet  Commonly known as:  CLARITIN  Take 10 mg by mouth daily.     mirtazapine 15 MG tablet  Commonly known as:  REMERON  Take 15 mg by mouth at bedtime.     ondansetron 4 MG disintegrating tablet  Commonly known as:  ZOFRAN-ODT  Take 4 mg by mouth every 6 (six) hours as needed for nausea or vomiting.     PARoxetine 20 MG tablet  Commonly known as:  PAXIL  Take 50 mg by mouth daily.     ranitidine 300 MG tablet  Commonly known as:  ZANTAC  Take 300 mg by mouth at bedtime.     SUMAtriptan 100 MG tablet  Commonly known as:  IMITREX  Take 100 mg by mouth 2 (two) times daily as needed for migraine.     valACYclovir 1000 MG tablet  Commonly known as:  VALTREX  Take 500 mg by mouth daily.           Follow-up Information    Follow up with GURLEY,Scott, PA-C In 2 weeks.   Specialty:  Physician Assistant   Why:  hospital follow up    Contact information:   [address] [place] [phone]        The results of significant diagnostics from this hospitalization (including imaging, microbiology, ancillary and laboratory) are listed below for reference.    Significant Diagnostic Studies: Ct Abdomen Pelvis W Contrast  07/23/2015  CLINICAL DATA:  Nausea and vomiting. Abdominal pain and distention over the last 14 hours. EXAM: CT ABDOMEN AND PELVIS WITH CONTRAST TECHNIQUE: Multidetector CT imaging of the abdomen and pelvis was performed using the standard protocol following bolus  administration of intravenous contrast. CONTRAST:  ISOVUE-300 IOPAMIDOL (ISOVUE-300) INJECTION 61% COMPARISON:  06/04/2004 FINDINGS: Lung bases are clear. No pleural or pericardial fluid. The liver appears normal. No calcified gallstones. The spleen is normal. The pancreas is normal. The adrenal glands are normal. The kidneys are normal. The aorta and IVC are normal. There is been previous hysterectomy. There is a small bowel obstruction pattern with markedly dilated fluid and air-filled small intestine involving the jejunum. The ileum is decompressed. Specific etiology of the obstruction is not identified. 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 IMPRESSION: Small bowel obstruction, moderate to high grade, near the jejunoileal junction. Specific etiology indeterminate. No free fluid or air. Electronically Signed   By: Paulina Fusi M.D.   On: 07/23/2015 15:56   Dg Abd Acute W/chest  07/23/2015  CLINICAL DATA:  Epigastric pain and abdominal pain starting last night, initial encounter nausea and vomiting EXAM: DG ABDOMEN ACUTE W/ 1V CHEST COMPARISON:  None. FINDINGS: Cardiomediastinal silhouette is unremarkable. No acute infiltrate or pleural effusion. No pulmonary edema. Minimal gaseous distended bowel loops in mid upper abdomen. Ileus or enteritis cannot be excluded. No free abdominal air. IMPRESSION: No acute disease within chest. Minimal gaseous distended bowel loops in mid upper abdomen. Ileus or enteritis cannot be excluded. Electronically Signed   By: Natasha Mead M.D.   On: 07/23/2015 14:35   Dg Abd Portable 2v  07/24/2015  CLINICAL DATA:  Small-bowel obstruction. EXAM: PORTABLE ABDOMEN - 2 VIEW COMPARISON:  CT 07/23/2015 abdominal series 514 2017. FINDINGS: NG tube noted with tip in the stomach. Soft tissue structures are unremarkable. Oral contrast noted throughout the colon and rectum. No evidence of small bowel distention. IV contrast in the bladder. IMPRESSION: 1. NG tube  noted with tip in the stomach. 2. No evidence of small bowel distention noted on today's exam. Oral contrast noted throughout the colon and rectum. No free air. Electronically Signed   By: Maisie Fus  Register   On: 07/24/2015 08:26    Microbiology: No results found for this or any previous visit (from the past 240 hour(s)).   Labs: Basic Metabolic Panel:  Recent Labs Lab 07/23/15 1220 07/24/15 0349  NA 137 141  K 3.4* 4.0  CL 103 112*  CO2 19* 25  GLUCOSE 146* 138*  BUN 9 10  CREATININE 0.79 0.68  CALCIUM 9.6 7.7*   Liver Function Tests:  Recent Labs Lab 07/23/15 1220  AST 30  ALT 18  ALKPHOS 83  BILITOT 0.6  PROT 7.6  ALBUMIN 4.5    Recent Labs Lab 07/23/15 1220  LIPASE 17   No results for input(s): AMMONIA in the last 168 hours. CBC:  Recent Labs Lab 07/23/15 1220 07/24/15 0349  WBC 12.2* 9.9  NEUTROABS 10.6*  --   HGB 13.9 11.4*  HCT 39.9 34.0*  MCV 89.5 92.4  PLT 321 251   Cardiac Enzymes: No results for input(s): CKTOTAL, CKMB, CKMBINDEX, TROPONINI in the last 168 hours. BNP: BNP (last 3 results) No results for input(s): BNP in the last 8760 hours.  ProBNP (last 3 results) No results for input(s): PROBNP in the last 8760 hours.  CBG: No results for input(s): GLUCAP in the last 168 hours.  Active Problems:   Small bowel obstruction (HCC)   Time coordinating discharge: <30 mins  Signed:  Selenne Coggin, ANP-BC

## 2015-07-24 NOTE — Progress Notes (Signed)
Subjective: Having bowel function, feels back to normal  Objective: Vital signs in last 24 hours: Temp:  [97.9 F (36.6 C)-99.4 F (37.4 C)] 98.6 F (37 C) (05/15 0537) Pulse Rate:  [86-129] 86 (05/15 0537) Resp:  [16-20] 16 (05/15 0537) BP: (91-145)/(53-94) 91/53 mmHg (05/15 0537) SpO2:  [95 %-100 %] 97 % (05/15 0537) Weight:  [68.04 kg (150 lb)-68.675 kg (151 lb 6.4 oz)] 68.675 kg (151 lb 6.4 oz) (05/14 2055) Last BM Date: 07/23/15  Intake/Output from previous day: 05/14 0701 - 05/15 0700 In: 1285 [I.V.:1165; NG/GT:120] Out: 1025 [Emesis/NG output:375] Intake/Output this shift:    Resp: clear to auscultation bilaterally Cardio: regular rate and rhythm GI: soft nontender nondistended bs present  Lab Results:   Recent Labs  07/23/15 1220 07/24/15 0349  WBC 12.2* 9.9  HGB 13.9 11.4*  HCT 39.9 34.0*  PLT 321 251   BMET  Recent Labs  07/23/15 1220 07/24/15 0349  NA 137 141  K 3.4* 4.0  CL 103 112*  CO2 19* 25  GLUCOSE 146* 138*  BUN 9 10  CREATININE 0.79 0.68  CALCIUM 9.6 7.7*   PT/INR No results for input(s): LABPROT, INR in the last 72 hours. ABG No results for input(s): PHART, HCO3 in the last 72 hours.  Invalid input(s): PCO2, PO2  Studies/Results: Ct Abdomen Pelvis W Contrast  07/23/2015  CLINICAL DATA:  Nausea and vomiting. Abdominal pain and distention over the last 14 hours. EXAM: CT ABDOMEN AND PELVIS WITH CONTRAST TECHNIQUE: Multidetector CT imaging of the abdomen and pelvis was performed using the standard protocol following bolus administration of intravenous contrast. CONTRAST:  100mL ISOVUE-300 IOPAMIDOL (ISOVUE-300) INJECTION 61% COMPARISON:  06/04/2004 FINDINGS: Lung bases are clear. No pleural or pericardial fluid. The liver appears normal. No calcified gallstones. The spleen is normal. The pancreas is normal. The adrenal glands are normal. The kidneys are normal. The aorta and IVC are normal. There is been previous hysterectomy. There  is a small bowel obstruction pattern with markedly dilated fluid and air-filled small intestine involving the jejunum. The ileum is decompressed. Specific etiology of the obstruction is not identified. 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 IMPRESSION: Small bowel obstruction, moderate to high grade, near the jejunoileal junction. Specific etiology indeterminate. No free fluid or air. Electronically Signed   By: Paulina FusiMark  Shogry M.D.   On: 07/23/2015 15:56   Dg Abd Acute W/chest  07/23/2015  CLINICAL DATA:  Epigastric pain and abdominal pain starting last night, initial encounter nausea and vomiting EXAM: DG ABDOMEN ACUTE W/ 1V CHEST COMPARISON:  None. FINDINGS: Cardiomediastinal silhouette is unremarkable. No acute infiltrate or pleural effusion. No pulmonary edema. Minimal gaseous distended bowel loops in mid upper abdomen. Ileus or enteritis cannot be excluded. No free abdominal air. IMPRESSION: No acute disease within chest. Minimal gaseous distended bowel loops in mid upper abdomen. Ileus or enteritis cannot be excluded. Electronically Signed   By: Natasha MeadLiviu  Pop M.D.   On: 07/23/2015 14:35   Dg Abd Portable 2v  07/24/2015  CLINICAL DATA:  Small-bowel obstruction. EXAM: PORTABLE ABDOMEN - 2 VIEW COMPARISON:  CT 07/23/2015 abdominal series 514 2017. FINDINGS: NG tube noted with tip in the stomach. Soft tissue structures are unremarkable. Oral contrast noted throughout the colon and rectum. No evidence of small bowel distention. IV contrast in the bladder. IMPRESSION: 1. NG tube noted with tip in the stomach. 2. No evidence of small bowel distention noted on today's exam. Oral contrast noted throughout the colon and rectum.  No free air. Electronically Signed   By: Maisie Fus  Register   On: 07/24/2015 08:26    Anti-infectives: Anti-infectives    None      Assessment/Plan: Sbo, resolved  Advance to fulls, can dc later today if tolerates Ng tube removed Sq  heparin  Estephani Popper 07/24/2015

## 2015-07-24 NOTE — Discharge Instructions (Signed)
Small Bowel Obstruction °A small bowel obstruction is a blockage in the small bowel. The small bowel, which is also called the small intestine, is a long, slender tube that connects the stomach to the colon. When a person eats and drinks, food and fluids go from the stomach to the small bowel. This is where most of the nutrients in the food and fluids are absorbed. °A small bowel obstruction will prevent food and fluids from passing through the small bowel as they normally do during digestion. The small bowel can become partially or completely blocked. This can cause symptoms such as abdominal pain, vomiting, and bloating. If this condition is not treated, it can be dangerous because the small bowel could rupture. °CAUSES °Common causes of this condition include: °· Scar tissue from previous surgery or radiation treatment. °· Recent surgery. This may cause the movements of the bowel to slow down and cause food to block the intestine. °· Hernias. °· Inflammatory bowel disease (colitis). °· Twisting of the bowel (volvulus). °· Tumors. °· A foreign body. °· Slipping of a part of the bowel into another part (intussusception). °SYMPTOMS °Symptoms of this condition include: °· Abdominal pain. This may be dull cramps or sharp pain. It may occur in one area, or it may be present in the entire abdomen. Pain can range from mild to severe, depending on the degree of obstruction. °· Nausea and vomiting. Vomit may be greenish or a yellow bile color. °· Abdominal bloating. °· Constipation. °· Lack of passing gas. °· Frequent belching. °· Diarrhea. This may occur if the obstruction is partial and runny stool is able to leak around the obstruction. °DIAGNOSIS °This condition may be diagnosed based on a physical exam, medical history, and X-rays of the abdomen. You may also have other tests, such as a CT scan of the abdomen and pelvis. °TREATMENT °Treatment for this condition depends on the cause and severity of the problem.  Treatment options may include: °· Bed rest along with fluids and pain medicines that are given through an IV tube inserted into one of your veins. Sometimes, this is all that is needed for the obstruction to improve. °· Following a simple diet. In some cases, a clear liquid diet may be required for several days. This allows the bowel to rest. °· Placement of a small tube (nasogastric tube) into the stomach. When the bowel is blocked, it usually swells up like a balloon that is filled with air and fluids. The air and fluids may be removed by suction through the nasogastric tube. This can help with pain, discomfort, and nausea. It can also help the obstruction to clear up faster. °· Surgery. This may be required if other treatments do not work. Bowel obstruction from a hernia may require early surgery and can be an emergency procedure. Surgery may also be required for scar tissue that causes frequent or severe obstructions. °HOME CARE INSTRUCTIONS °· Get plenty of rest. °· Follow instructions from your health care provider about eating restrictions. You may need to avoid solid foods and consume only clear liquids until your condition improves. °· Take over-the-counter and prescription medicines only as told by your health care provider. °· Keep all follow-up visits as told by your health care provider. This is important. °SEEK MEDICAL CARE IF: °· You have a fever. °· You have chills. °SEEK IMMEDIATE MEDICAL CARE IF: °· You have increased pain or cramping. °· You vomit blood. °· You have uncontrolled vomiting or nausea. °· You cannot drink   fluids because of vomiting or pain.  You develop confusion.  You begin feeling very dry or thirsty (dehydrated).  You have severe bloating.  You feel extremely weak or you faint.   This information is not intended to replace advice given to you by your health care provider. Make sure you discuss any questions you have with your health care provider.   Document Released:  05/14/2005 Document Revised: 11/16/2014 Document Reviewed: 04/21/2014 Elsevier Interactive Patient Education 2016 [employer]  GETTING TO GOOD BOWEL HEALTH. Irregular bowel habits such as constipation and diarrhea can lead to many problems over time.  Having one soft bowel movement a day is the most important way to prevent further problems.  The anorectal canal is designed to handle stretching and feces to safely manage our ability to get rid of solid waste (feces, poop, stool) out of our body.  BUT, hard constipated stools can act like ripping concrete bricks and diarrhea can be a burning fire to this very sensitive area of our body, causing inflamed hemorrhoids, anal fissures, increasing risk is perirectal abscesses, abdominal pain/bloating, an making irritable bowel worse.     The goal: ONE SOFT BOWEL MOVEMENT A DAY!  To have soft, regular bowel movements:   Drink at least 8 tall glasses of water a day.    Take plenty of fiber.  Fiber is the undigested part of plant food that passes into the colon, acting s natures broom to encourage bowel motility and movement.  Fiber can absorb and hold large amounts of water. This results in a larger, bulkier stool, which is soft and easier to pass. Work gradually over several weeks up to 6 servings a day of fiber (25g a day even more if needed) in the form of: o Vegetables -- Root (potatoes, carrots, turnips), leafy green (lettuce, salad greens, celery, spinach), or cooked high residue (cabbage, broccoli, etc) o Fruit -- Fresh (unpeeled skin & pulp), Dried (prunes, apricots, cherries, etc ),  or stewed ( applesauce)  o Whole grain breads, pasta, etc (whole wheat)  o Bran cereals   Bulking Agents -- This type of water-retaining fiber generally is easily obtained each day by one of the following:  o Psyllium bran -- The psyllium plant is remarkable because its ground seeds can retain so much water. This product is available as Metamucil, Konsyl,  Effersyllium, Per Diem Fiber, or the less expensive generic preparation in drug and health food stores. Although labeled a laxative, it really is not a laxative.  o Methylcellulose -- This is another fiber derived from wood which also retains water. It is available as Citrucel. o Polyethylene Glycol - and artificial fiber commonly called Miralax or Glycolax.  It is helpful for people with gassy or bloated feelings with regular fiber o Flax Seed - a less gassy fiber than psyllium  No reading or other relaxing activity while on the toilet. If bowel movements take longer than 5 minutes, you are too constipated  AVOID CONSTIPATION.  High fiber and water intake usually takes care of this.  Sometimes a laxative is needed to stimulate more frequent bowel movements, but   Laxatives are not a good long-term solution as it can wear the colon out. o Osmotics (Milk of Magnesia, Fleets phosphosoda, Magnesium citrate, MiraLax, GoLytely) are safer than  o Stimulants (Senokot, Castor Oil, Dulcolax, Ex Lax)    o Do not take laxatives for more than 7days in a row.   IF SEVERELY CONSTIPATED, try a Bowel Retraining Program: o  Do not use laxatives.  o Eat a diet high in roughage, such as bran cereals and leafy vegetables.  o Drink six (6) ounces of prune or apricot juice each morning.  o Eat two (2) large servings of stewed fruit each day.  o Take one (1) heaping tablespoon of a psyllium-based bulking agent twice a day. Use sugar-free sweetener when possible to avoid excessive calories.  o Eat a normal breakfast.  o Set aside 15 minutes after breakfast to sit on the toilet, but do not strain to have a bowel movement.  o If you do not have a bowel movement by the third day, use an enema and repeat the above steps.   Controlling diarrhea o Switch to liquids and simpler foods for a few days to avoid stressing your intestines further. o Avoid dairy products (especially milk & ice cream) for a short time.  The  intestines often can lose the ability to digest lactose when stressed. o Avoid foods that cause gassiness or bloating.  Typical foods include beans and other legumes, cabbage, broccoli, and dairy foods.  Every person has some sensitivity to other foods, so listen to our body and avoid those foods that trigger problems for you. o Adding fiber (Citrucel, Metamucil, psyllium, Miralax) gradually can help thicken stools by absorbing excess fluid and retrain the intestines to act more normally.  Slowly increase the dose over a few weeks.  Too much fiber too soon can backfire and cause cramping & bloating. o Probiotics (such as active yogurt, Align, etc) may help repopulate the intestines and colon with normal bacteria and calm down a sensitive digestive tract.  Most studies show it to be of mild help, though, and such products can be costly. o Medicines: - Bismuth subsalicylate (ex. Kayopectate, Pepto Bismol) every 30 minutes for up to 6 doses can help control diarrhea.  Avoid if pregnant. - Loperamide (Immodium) can slow down diarrhea.  Start with two tablets (4mg  total) first and then try one tablet every 6 hours.  Avoid if you are having fevers or severe pain.  If you are not better or start feeling worse, stop all medicines and call your doctor for advice o Call your doctor if you are getting worse or not better.  Sometimes further testing (cultures, endoscopy, X-ray studies, bloodwork, etc) may be needed to help diagnose and treat the cause of the diarrhe o

## 2016-03-06 ENCOUNTER — Other Ambulatory Visit: Payer: Self-pay | Admitting: Obstetrics and Gynecology

## 2016-03-06 DIAGNOSIS — R928 Other abnormal and inconclusive findings on diagnostic imaging of breast: Secondary | ICD-10-CM

## 2016-11-05 IMAGING — DX DG ABDOMEN ACUTE W/ 1V CHEST
4 series · 4 of 4 positions shown · non-contrast
Comparison: None.

CLINICAL DATA: Epigastric pain and abdominal pain starting last
night, initial encounter nausea and vomiting

EXAM:
DG ABDOMEN ACUTE W/ 1V CHEST

[chest pa]
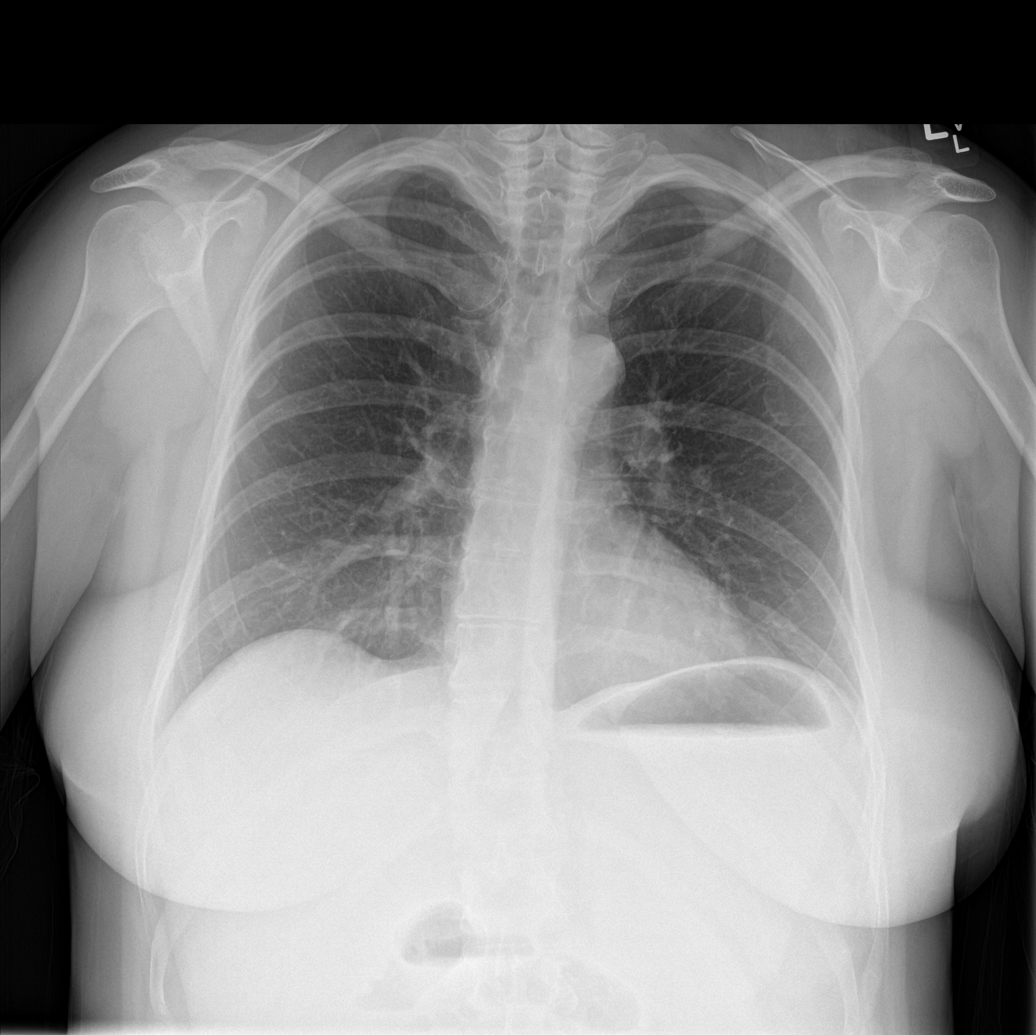

[abdomen erect]
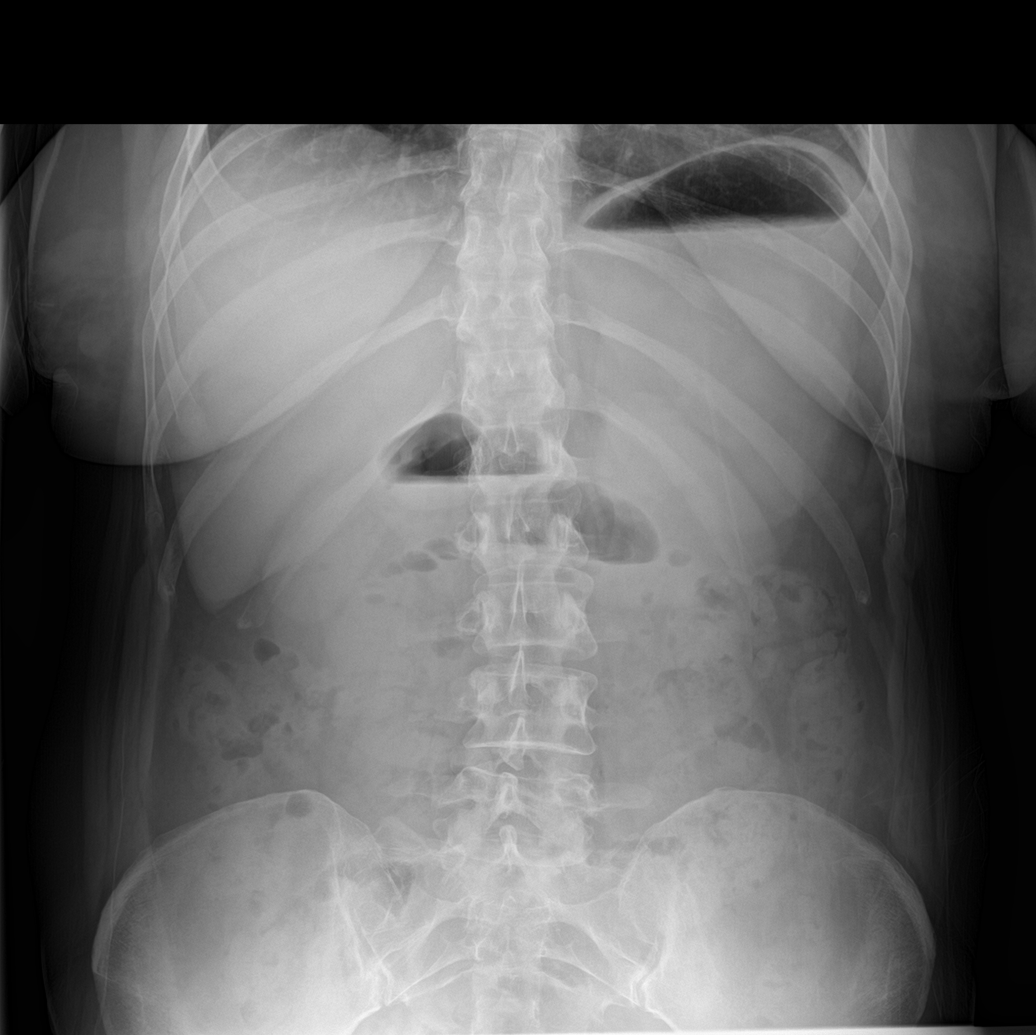

[abdomen supine (1 of 2)]
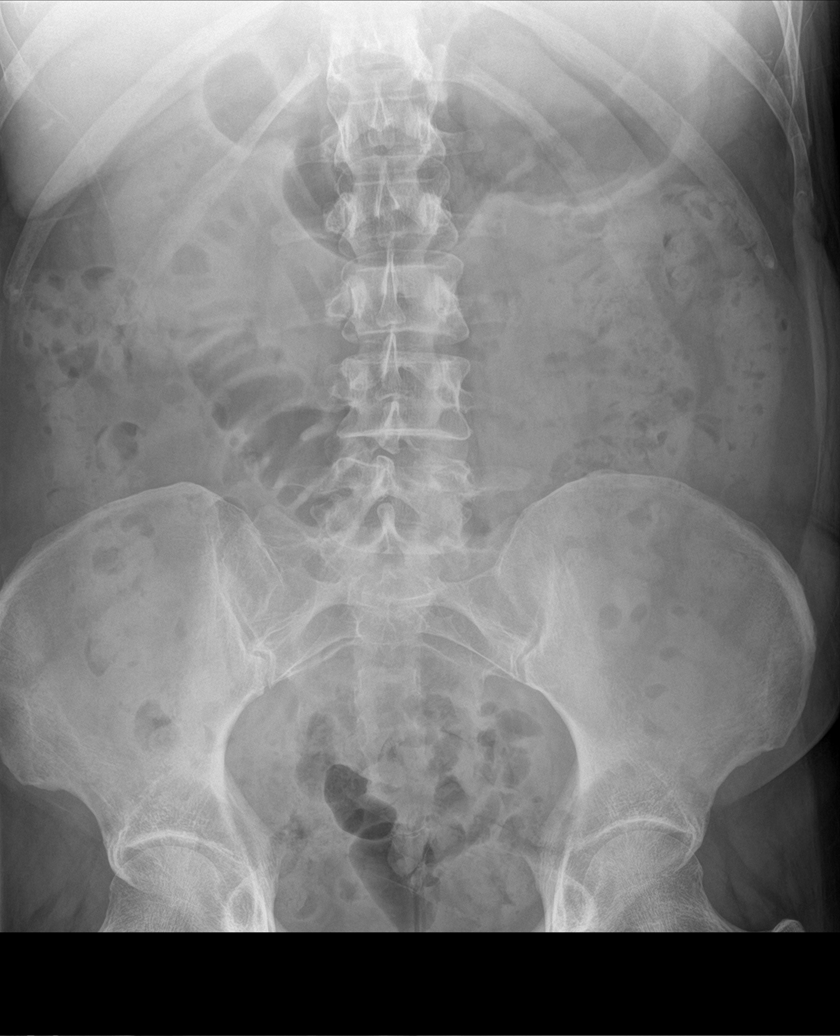

[abdomen supine (2 of 2)]
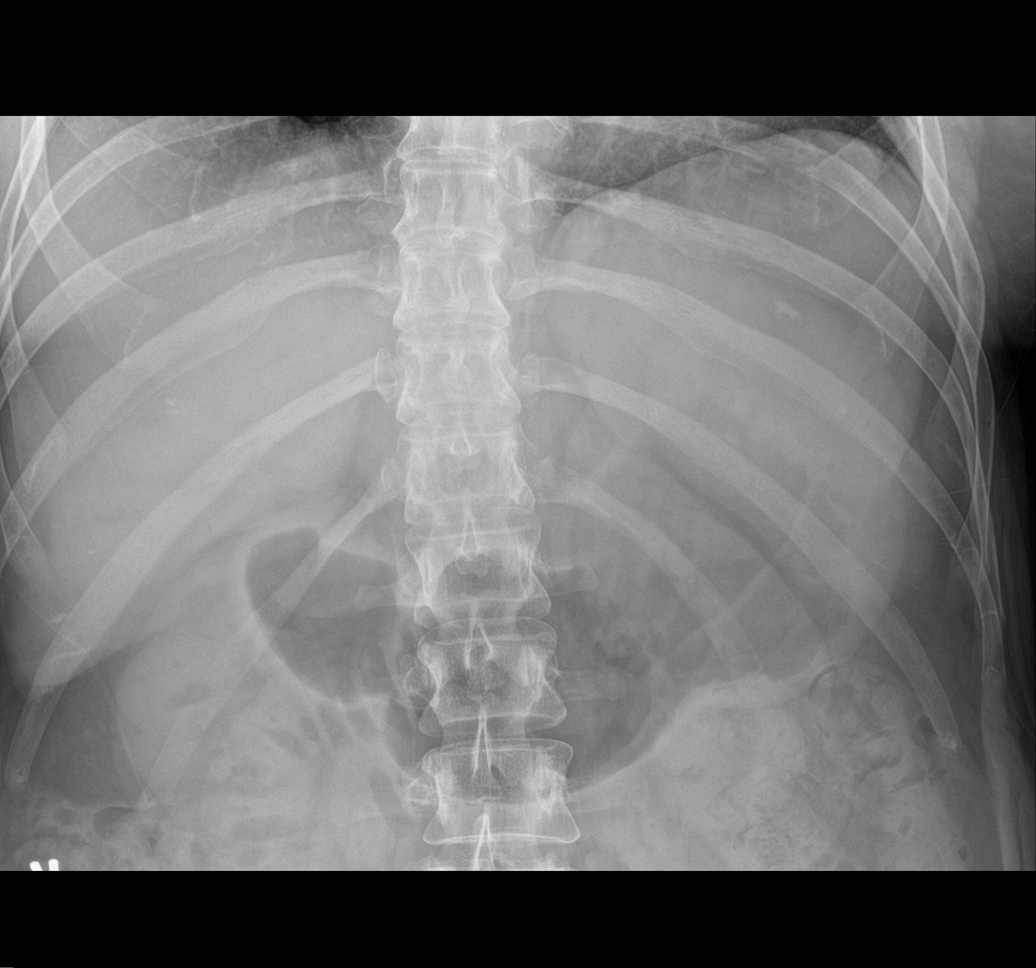

[4 of 4 positions shown; findings below may reference images not displayed]

FINDINGS: Cardiomediastinal silhouette is unremarkable. No acute infiltrate or
pleural effusion. No pulmonary edema. Minimal gaseous distended
bowel loops in mid upper abdomen. Ileus or enteritis cannot be
excluded. No free abdominal air.
IMPRESSION: No acute disease within chest. Minimal gaseous distended bowel loops
in mid upper abdomen. Ileus or enteritis cannot be excluded.

## 2019-05-14 ENCOUNTER — Ambulatory Visit: Payer: BLUE CROSS/BLUE SHIELD | Attending: Internal Medicine

## 2019-05-14 DIAGNOSIS — Z23 Encounter for immunization: Secondary | ICD-10-CM | POA: Insufficient documentation

## 2019-05-14 NOTE — Progress Notes (Signed)
   Covid-19 Vaccination Clinic  Name:  Beverly Shepherd    MRN: [id number] DOB: [birth year]  05/14/2019  Beverly Shepherd was observed post Covid-19 immunization for 15 minutes without incident. She was provided with Vaccine Information Sheet and instruction to access the V-Safe system.   Beverly Shepherd was instructed to call 911 with any severe reactions post vaccine: Marland Kitchen Difficulty breathing  . Swelling of face and throat  . A fast heartbeat  . A bad rash all over body  . Dizziness and weakness   Immunizations Administered    Name Date Dose VIS Date Route   Pfizer COVID-19 Vaccine 05/14/2019 12:39 PM 0.3 mL 02/19/2019 Intramuscular   Manufacturer: [employer], [employer]   Lot: [id number]   NDC: [address]-0

## 2019-06-09 ENCOUNTER — Ambulatory Visit: Payer: Self-pay | Attending: Internal Medicine

## 2019-06-09 DIAGNOSIS — Z23 Encounter for immunization: Secondary | ICD-10-CM

## 2019-06-09 NOTE — Progress Notes (Signed)
   Covid-19 Vaccination Clinic  Name:  Beverly Shepherd    MRN: [id number] DOB: 03/15/1970  06/09/2019  [name] was observed post Covid-19 immunization for 15 minutes without incident. She was provided with Vaccine Information Sheet and instruction to access the V-Safe system.   [name] was instructed to call 911 with any severe reactions post vaccine: Marland Kitchen Difficulty breathing  . Swelling of face and throat  . A fast heartbeat  . A bad rash all over body  . Dizziness and weakness   Immunizations Administered    Name Date Dose VIS Date Route   Pfizer COVID-19 Vaccine 06/09/2019 12:42 PM 0.3 mL 02/19/2019 Intramuscular   Manufacturer: [employer], [employer]   Lot: [id number]   NDC: [address]-1

## 2021-08-16 ENCOUNTER — Emergency Department
Admission: EM | Admit: 2021-08-16 | Discharge: 2021-08-16 | Disposition: A | Discharge status: Home / Self Care | Payer: BC Managed Care – PPO | Source: Home / Self Care

## 2021-08-16 ENCOUNTER — Other Ambulatory Visit: Payer: Self-pay

## 2021-08-16 ENCOUNTER — Encounter: Payer: Self-pay | Admitting: Emergency Medicine

## 2021-08-16 DIAGNOSIS — G43001 Migraine without aura, not intractable, with status migrainosus: Secondary | ICD-10-CM | POA: Diagnosis not present

## 2021-08-16 DIAGNOSIS — R110 Nausea: Secondary | ICD-10-CM | POA: Diagnosis not present

## 2021-08-16 DIAGNOSIS — R519 Headache, unspecified: Secondary | ICD-10-CM

## 2021-08-16 MED ORDER — PROMETHAZINE HCL 25 MG PO TABS: 25.0000 mg | ORAL_TABLET | Freq: Every day | ORAL | 0 refills | Status: DC | PRN

## 2021-08-16 MED ORDER — KETOROLAC TROMETHAMINE 60 MG/2ML IM SOLN
60.0000 mg | Freq: Once | INTRAMUSCULAR | Status: AC
Start: 2021-08-16 — End: 2021-08-16
  Administered 2021-08-16: 60 mg via INTRAMUSCULAR

## 2021-08-16 MED ORDER — METOCLOPRAMIDE HCL 5 MG/ML IJ SOLN
10.0000 mg | INTRAMUSCULAR | Status: AC
  Administered 2021-08-16: 10 mg via INTRAMUSCULAR

## 2021-08-16 NOTE — Discharge Instructions (Addendum)
Advised patient may take medication (Phenergan) daily as needed for nausea.  Encouraged patient to increase daily water intake while taking this medication.  Advised patient if symptoms worsen and/or unresolved please follow-up with PCP or here for further evaluation.

## 2021-08-16 NOTE — ED Provider Notes (Signed)
[facility]    CSN: [id number] Arrival date & time: 08/16/21  1310      History   Chief Complaint Chief Complaint  Patient presents with   Headache    HPI Beverly Shepherd is a 51 y.o. female.   HPI 51 year old female presents with migraine headache for 2 days reports taking Imitrex with little to no relief.  PMH significant for migraine and IBS.Marland Kitchen  Past Medical History:  Diagnosis Date   Anxiety    GERD (gastroesophageal reflux disease)    IBS (irritable bowel syndrome)    Insomnia    Migraine    Palpitations     Patient Active Problem List   Diagnosis Date Noted   Small bowel obstruction (HCC) 07/23/2015   MIGRAINE, COMMON 11/29/2009   CERVICAL SPASM 11/29/2009    Past Surgical History:  Procedure Laterality Date   ABDOMINAL HYSTERECTOMY      OB History   No obstetric history on file.      Home Medications    Prior to Admission medications   Medication Sig Start Date End Date Taking? Authorizing Provider  promethazine (PHENERGAN) 25 MG tablet Take 1 tablet (25 mg total) by mouth daily as needed for nausea or vomiting. 08/16/21  Yes Trevor Iha, FNP  ALPRAZolam Prudy Feeler) 1 MG tablet Take 1 mg by mouth daily. 09/22/14   [provider]  aspirin EC 81 MG tablet Take 81 mg by mouth daily.    [provider]  ibuprofen (ADVIL,MOTRIN) 200 MG tablet Take 800 mg by mouth daily as needed for headache or moderate pain.    [provider]  lithium carbonate 300 MG capsule Take 300 mg by mouth daily.     [provider]  loratadine (CLARITIN) 10 MG tablet Take 10 mg by mouth daily.    [provider]  mirtazapine (REMERON) 15 MG tablet Take 15 mg by mouth at bedtime.    [provider]  ondansetron (ZOFRAN-ODT) 4 MG disintegrating tablet Take 4 mg by mouth every 6 (six) hours as needed for nausea or vomiting.  07/07/15   [provider]  PARoxetine (PAXIL) 20 MG tablet Take 50 mg by mouth daily.     [provider]  ranitidine (ZANTAC) 300 MG tablet Take 300 mg by mouth at bedtime.    [provider]  SUMAtriptan (IMITREX) 100 MG tablet Take 100 mg by mouth 2 (two) times daily as needed for migraine.  07/11/15   [provider]  valACYclovir (VALTREX) 1000 MG tablet Take 500 mg by mouth daily. 07/11/15   [provider]    Family History Family History  Problem Relation Age of Onset   Congestive Heart Failure Father    Diabetes Father    Hypertension Father    COPD Father    Healthy Mother     Social History Social History   Tobacco Use   Smoking status: Never   Smokeless tobacco: Never  Vaping Use   Vaping Use: Never used  Substance Use Topics   Alcohol use: Yes    Alcohol/week: 0.0 standard drinks of alcohol    Comment: occasionally   Drug use: No     Allergies   Codeine phosphate [codeine], Duloxetine, Prednisone, and Sertraline   Review of Systems Review of Systems  Neurological:  Positive for headaches.     Physical Exam Triage Vital Signs ED Triage Vitals  Enc Vitals Group     BP 08/16/21 1327 125/83  Pulse Rate 08/16/21 1327 98     Resp 08/16/21 1327 16     Temp 08/16/21 1327 98.5 F (36.9 C)     Temp Source 08/16/21 1327 Oral     SpO2 08/16/21 1327 97 %     Weight 08/16/21 1328 182 lb (82.6 kg)     Height 08/16/21 1328 5\' 4"  (1.626 m)     Head Circumference --      Peak Flow --      Pain Score 08/16/21 1328 8     Pain Loc --      Pain Edu? --      Excl. in GC? --    No data found.  Updated Vital Signs BP 125/83 (BP Location: Left Arm)   Pulse 98   Temp 98.5 F (36.9 C) (Oral)   Resp 16   Ht 5\' 4"  (1.626 m)   Wt 182 lb (82.6 kg)   SpO2 97%   BMI 31.24 kg/m      Physical Exam Vitals and nursing note reviewed.  Constitutional:      Appearance: Normal appearance. She is well-developed. She is obese.  HENT:     Head: Normocephalic and atraumatic.     Right Ear: Tympanic membrane, ear canal  and external ear normal.     Left Ear: Tympanic membrane, ear canal and external ear normal.     Mouth/Throat:     Mouth: Mucous membranes are moist.     Pharynx: Oropharynx is clear.  Eyes:     Extraocular Movements: Extraocular movements intact.     Pupils: Pupils are equal, round, and reactive to light.  Cardiovascular:     Rate and Rhythm: Normal rate and regular rhythm.     Pulses: Normal pulses.     Heart sounds: Normal heart sounds. No murmur heard. Pulmonary:     Effort: Pulmonary effort is normal.     Breath sounds: Normal breath sounds. No wheezing, rhonchi or rales.  Musculoskeletal:     Cervical back: Normal range of motion and neck supple.  Skin:    General: Skin is warm and dry.  Neurological:     General: No focal deficit present.     Mental Status: She is alert and oriented to person, place, and time.     Cranial Nerves: No cranial nerve deficit.     Sensory: No sensory deficit.     Motor: No weakness.     Coordination: Coordination normal.      UC Treatments / Results  Labs (all labs ordered are listed, but only abnormal results are displayed) Labs Reviewed - No data to display  EKG   Radiology No results found.  Procedures Procedures (including critical care time)  Medications Ordered in UC Medications  ketorolac (TORADOL) injection 60 mg (has no administration in time range)  metoCLOPramide (REGLAN) injection 10 mg (has no administration in time range)    Initial Impression / Assessment and Plan / UC Course  I have reviewed the triage vital signs and the nursing notes.  Pertinent labs & imaging results that were available during my care of the patient were reviewed by me and considered in my medical decision making (see chart for details).     MDM: 1.  Headache disorder-IM Toradol 60 mg and IM Reglan 10 mg given once in the clinic prior to discharge, work note provided per patient request; 2.  Nausea-Rx'd Phenergan. Advised patient may take  medication (Phenergan) daily as needed for nausea.  Encouraged  patient to increase daily water intake while taking this medication.  Advised patient if symptoms worsen and/or unresolved please follow-up with PCP or here for further evaluation.  Patient discharged home, hemodynamically stable. Final Clinical Impressions(s) / UC Diagnoses   Final diagnoses:  Migraine without aura and with status migrainosus, not intractable  Nausea     Discharge Instructions      Advised patient may take medication (Phenergan) daily as needed for nausea.  Encouraged patient to increase daily water intake while taking this medication.  Advised patient if symptoms worsen and/or unresolved please follow-up with PCP or here for further evaluation.     ED Prescriptions     Medication Sig Dispense Auth. Provider   promethazine (PHENERGAN) 25 MG tablet Take 1 tablet (25 mg total) by mouth daily as needed for nausea or vomiting. 15 tablet Trevor Iha, FNP      PDMP not reviewed this encounter.   Trevor Iha, FNP 08/16/21 1356

## 2021-08-16 NOTE — ED Triage Notes (Signed)
Migraine x 2 days, left temple, took immitrex with no relief.

## 2021-11-02 ENCOUNTER — Ambulatory Visit
Admission: EM | Admit: 2021-11-02 | Discharge: 2021-11-02 | Disposition: A | Discharge status: Home / Self Care | Payer: BC Managed Care – PPO

## 2021-11-02 DIAGNOSIS — R519 Headache, unspecified: Secondary | ICD-10-CM | POA: Diagnosis not present

## 2021-11-02 MED ORDER — KETOROLAC TROMETHAMINE 60 MG/2ML IM SOLN
60.0000 mg | Freq: Once | INTRAMUSCULAR | Status: AC
  Administered 2021-11-02: 60 mg via INTRAMUSCULAR

## 2021-11-02 MED ORDER — METOCLOPRAMIDE HCL 5 MG/ML IJ SOLN
10.0000 mg | Freq: Once | INTRAMUSCULAR | Status: AC
  Administered 2021-11-02: 10 mg via INTRAMUSCULAR

## 2021-11-02 NOTE — Discharge Instructions (Addendum)
Encouraged patient to increase daily water intake and to avoid going long periods of time without meal or snack.  Advised patient if symptoms worsen and/or unresolved please follow-up with PCP or here for further evaluation.

## 2021-11-02 NOTE — ED Triage Notes (Signed)
Pt c/o headache since Wednesday. Some nausea. Hx of headaches. Last seen in June. Tx with toradol and reglan. Imitrex yesterday as well as Aimovig with no relief.

## 2021-11-02 NOTE — ED Provider Notes (Signed)
[facility]    CSN: [id number] Arrival date & time: 11/02/21  1314      History   Chief Complaint Chief Complaint  Patient presents with   Headache    HPI Beverly Shepherd is a 51 y.o. female.   HPI Pleasant 51 year old female presents with bad headache since Wednesday.  Reports some nausea.  Patient reports treated in June with Toradol and Reglan which helped her resolve headache.  Patient reports taking Imitrex and Aimovig yesterday with no relief.  PMH significant for obesity, IBS, and migraine.  Patient request work note from Wednesday through Saturday.  Past Medical History:  Diagnosis Date   Anxiety    GERD (gastroesophageal reflux disease)    IBS (irritable bowel syndrome)    Insomnia    Migraine    Palpitations     Patient Active Problem List   Diagnosis Date Noted   Small bowel obstruction (HCC) 07/23/2015   MIGRAINE, COMMON 11/29/2009   CERVICAL SPASM 11/29/2009    Past Surgical History:  Procedure Laterality Date   ABDOMINAL HYSTERECTOMY      OB History   No obstetric history on file.      Home Medications    Prior to Admission medications   Medication Sig Start Date End Date Taking? Authorizing Provider  ALPRAZolam Prudy Feeler) 1 MG tablet Take 1 mg by mouth daily. 09/22/14   [provider]  aspirin EC 81 MG tablet Take 81 mg by mouth daily.    [provider]  Erenumab-aooe (AIMOVIG) 140 MG/ML SOAJ Aimovig Autoinjector 140 mg/mL subcutaneous auto-injector    [provider]  ibuprofen (ADVIL,MOTRIN) 200 MG tablet Take 800 mg by mouth daily as needed for headache or moderate pain.    [provider]  lithium carbonate 300 MG capsule Take 300 mg by mouth daily.     [provider]  loratadine (CLARITIN) 10 MG tablet Take 10 mg by mouth daily.    [provider]  mirtazapine (REMERON) 15 MG tablet Take 15 mg by mouth at bedtime.    [provider]  ondansetron (ZOFRAN-ODT) 4 MG  disintegrating tablet Take 4 mg by mouth every 6 (six) hours as needed for nausea or vomiting.  07/07/15   [provider]  PARoxetine (PAXIL) 20 MG tablet Take 50 mg by mouth daily.    [provider]  promethazine (PHENERGAN) 25 MG tablet Take 1 tablet (25 mg total) by mouth daily as needed for nausea or vomiting. 08/16/21   Trevor Iha, FNP  ranitidine (ZANTAC) 300 MG tablet Take 300 mg by mouth at bedtime.    [provider]  SUMAtriptan (IMITREX) 100 MG tablet Take 100 mg by mouth 2 (two) times daily as needed for migraine.  07/11/15   [provider]  valACYclovir (VALTREX) 1000 MG tablet Take 500 mg by mouth daily. 07/11/15   [provider]    Family History Family History  Problem Relation Age of Onset   Congestive Heart Failure Father    Diabetes Father    Hypertension Father    COPD Father    Healthy Mother     Social History Social History   Tobacco Use   Smoking status: Never   Smokeless tobacco: Never  Vaping Use   Vaping Use: Never used  Substance Use Topics   Alcohol use: Yes    Alcohol/week: 0.0 standard drinks of alcohol    Comment: occasionally   Drug use: No     Allergies  Codeine phosphate [codeine], Duloxetine, Prednisone, and Sertraline   Review of Systems Review of Systems  Neurological:  Positive for headaches.  All other systems reviewed and are negative.    Physical Exam Triage Vital Signs ED Triage Vitals  Enc Vitals Group     BP 11/02/21 1347 127/81     Pulse Rate 11/02/21 1347 (!) 116     Resp 11/02/21 1347 17     Temp 11/02/21 1347 98.6 F (37 C)     Temp Source 11/02/21 1347 Oral     SpO2 11/02/21 1347 96 %     Weight --      Height --      Head Circumference --      Peak Flow --      Pain Score 11/02/21 1350 7     Pain Loc --      Pain Edu? --      Excl. in GC? --    No data found.  Updated Vital Signs BP 127/81 (BP Location: Right Arm)   Pulse (!) 116   Temp 98.6 F (37  C) (Oral)   Resp 17   SpO2 96%   Physical Exam Vitals and nursing note reviewed.  Constitutional:      General: She is not in acute distress.    Appearance: She is obese. She is not ill-appearing.  HENT:     Head: Normocephalic and atraumatic.     Right Ear: Tympanic membrane, ear canal and external ear normal.     Left Ear: Tympanic membrane, ear canal and external ear normal.     Mouth/Throat:     Mouth: Mucous membranes are moist.     Pharynx: Oropharynx is clear.  Eyes:     Extraocular Movements: Extraocular movements intact.     Conjunctiva/sclera: Conjunctivae normal.     Pupils: Pupils are equal, round, and reactive to light.  Cardiovascular:     Rate and Rhythm: Normal rate and regular rhythm.     Pulses: Normal pulses.     Heart sounds: Normal heart sounds.  Pulmonary:     Effort: Pulmonary effort is normal.     Breath sounds: Normal breath sounds. No wheezing, rhonchi or rales.  Musculoskeletal:        General: Normal range of motion.     Cervical back: Normal range of motion and neck supple.  Skin:    General: Skin is warm and dry.  Neurological:     General: No focal deficit present.     Mental Status: She is alert and oriented to person, place, and time.     Cranial Nerves: No cranial nerve deficit.     Sensory: No sensory deficit.     Motor: No weakness.     Gait: Gait normal.      UC Treatments / Results  Labs (all labs ordered are listed, but only abnormal results are displayed) Labs Reviewed - No data to display  EKG   Radiology No results found.  Procedures Procedures (including critical care time)  Medications Ordered in UC Medications  ketorolac (TORADOL) injection 60 mg (60 mg Intramuscular Given 11/02/21 1416)  metoCLOPramide (REGLAN) injection 10 mg (10 mg Intramuscular Given 11/02/21 1416)    Initial Impression / Assessment and Plan / UC Course  I have reviewed the triage vital signs and the nursing notes.  Pertinent labs &  imaging results that were available during my care of the patient were reviewed by me and considered in my medical  decision making (see chart for details).     MDM: 1.  Bad headache-IM Toradol 60 mg, IM Reglan 10 mg given once in clinic, prior to discharge today. Encouraged patient to increase daily water intake and to avoid going long periods of time without meal or snack.  Advised patient if symptoms worsen and/or unresolved please follow-up with PCP or here for further evaluation.  Work note provided to patient per request.  Patient discharged home, hemodynamically stable. Final Clinical Impressions(s) / UC Diagnoses   Final diagnoses:  Bad headache     Discharge Instructions      Encouraged patient to increase daily water intake and to avoid going long periods of time without meal or snack.  Advised patient if symptoms worsen and/or unresolved please follow-up with PCP or here for further evaluation.     ED Prescriptions   None    PDMP not reviewed this encounter.   Trevor Iha, FNP 11/02/21 1500

## 2021-11-30 ENCOUNTER — Ambulatory Visit
Admission: EM | Admit: 2021-11-30 | Discharge: 2021-11-30 | Disposition: A | Discharge status: Home / Self Care | Payer: BC Managed Care – PPO | Attending: Urgent Care | Admitting: Urgent Care

## 2021-11-30 ENCOUNTER — Encounter: Payer: Self-pay | Admitting: Emergency Medicine

## 2021-11-30 DIAGNOSIS — G43001 Migraine without aura, not intractable, with status migrainosus: Secondary | ICD-10-CM

## 2021-11-30 MED ORDER — KETOROLAC TROMETHAMINE 60 MG/2ML IM SOLN
60.0000 mg | Freq: Once | INTRAMUSCULAR | Status: AC
  Administered 2021-11-30: 60 mg via INTRAMUSCULAR

## 2021-11-30 MED ORDER — METOCLOPRAMIDE HCL 5 MG/ML IJ SOLN
10.0000 mg | Freq: Once | INTRAMUSCULAR | Status: AC
  Administered 2021-11-30: 10 mg via INTRAMUSCULAR

## 2021-11-30 NOTE — ED Provider Notes (Signed)
[facility]    CSN: [id number] Arrival date & time: 11/30/21  1836      History   Chief Complaint Chief Complaint  Patient presents with   Migraine    HPI Beverly Shepherd is a 51 y.o. female.   Pleasant 51 year old female who has a known history of migraine disorder since age 75 presents today due to concern of an intermittent migraines since Sunday.  She follows with a neurologist.  Was previously on Emgality which worked well for her, but her insurance stopped covering it.  She states since stopping this medication, she has had recurrent and intermittent migraines more than half the month.  Her current symptoms have been since Sunday intermittently, but have not resolved since yesterday morning.  Last dose of Imitrex was yesterday.  Has not taken anything today for her symptoms.  Had hoped to get an IV infusion with her neurologist, but they were unable to fit her in.  Patient is here per specifically requesting ketorolac and Reglan, she has had these IM injections in the past and states it works well for her symptoms.  She admits to nausea but denies vomiting.  Has a decreased appetite but is still eating.  Denies any red flag signs or symptoms.   Migraine    Past Medical History:  Diagnosis Date   Anxiety    GERD (gastroesophageal reflux disease)    IBS (irritable bowel syndrome)    Insomnia    Migraine    Palpitations     Patient Active Problem List   Diagnosis Date Noted   Small bowel obstruction ([place]) 07/23/2015   MIGRAINE, COMMON 11/29/2009   CERVICAL SPASM 11/29/2009    Past Surgical History:  Procedure Laterality Date   ABDOMINAL HYSTERECTOMY      OB History   No obstetric history on file.      Home Medications    Prior to Admission medications   Medication Sig Start Date End Date Taking? Authorizing Provider  ALPRAZolam Duanne Moron) 1 MG tablet Take 1 mg by mouth daily. 09/22/14  Yes [provider]  aspirin EC 81 MG tablet Take  81 mg by mouth daily.   Yes [provider]  Erenumab-aooe (AIMOVIG) 140 MG/ML SOAJ Aimovig Autoinjector 140 mg/mL subcutaneous auto-injector   Yes [provider]  ibuprofen (ADVIL,MOTRIN) 200 MG tablet Take 800 mg by mouth daily as needed for headache or moderate pain.   Yes [provider]  lithium carbonate 300 MG capsule Take 300 mg by mouth daily.    Yes [provider]  loratadine (CLARITIN) 10 MG tablet Take 10 mg by mouth daily.   Yes [provider]  mirtazapine (REMERON) 15 MG tablet Take 15 mg by mouth at bedtime.   Yes [provider]  ondansetron (ZOFRAN-ODT) 4 MG disintegrating tablet Take 4 mg by mouth every 6 (six) hours as needed for nausea or vomiting.  07/07/15  Yes [provider]  PARoxetine (PAXIL) 20 MG tablet Take 50 mg by mouth daily.   Yes [provider]  promethazine (PHENERGAN) 25 MG tablet Take 1 tablet (25 mg total) by mouth daily as needed for nausea or vomiting. 08/16/21  Yes Eliezer Lofts, FNP  ranitidine (ZANTAC) 300 MG tablet Take 300 mg by mouth at bedtime.   Yes [provider]  SUMAtriptan (IMITREX) 100 MG tablet Take 100 mg by mouth 2 (two) times daily as needed for migraine.  07/11/15  Yes [provider]  valACYclovir (VALTREX) 1000  MG tablet Take 500 mg by mouth daily. 07/11/15  Yes [provider]    Family History Family History  Problem Relation Age of Onset   Congestive Heart Failure Father    Diabetes Father    Hypertension Father    COPD Father    Healthy Mother     Social History Social History   Tobacco Use   Smoking status: Never   Smokeless tobacco: Never  Vaping Use   Vaping Use: Never used  Substance Use Topics   Alcohol use: Yes    Alcohol/week: 0.0 standard drinks of alcohol    Comment: occasionally   Drug use: No     Allergies   Codeine phosphate [codeine], Duloxetine, Prednisone, and Sertraline   Review of Systems Review  of Systems As per HPI  Physical Exam Triage Vital Signs ED Triage Vitals  Enc Vitals Group     BP 11/30/21 1849 119/80     Pulse Rate 11/30/21 1849 98     Resp 11/30/21 1849 18     Temp 11/30/21 1849 98.6 F (37 C)     Temp Source 11/30/21 1849 Oral     SpO2 11/30/21 1849 96 %     Weight 11/30/21 1851 180 lb (81.6 kg)     Height 11/30/21 1851 5\' 4"  (1.626 m)     Head Circumference --      Peak Flow --      Pain Score 11/30/21 1851 7     Pain Loc --      Pain Edu? --      Excl. in [place]? --    No data found.  Updated Vital Signs BP 119/80 (BP Location: Right Arm)   Pulse 98   Temp 98.6 F (37 C) (Oral)   Resp 18   Ht 5\' 4"  (1.626 m)   Wt 180 lb (81.6 kg)   SpO2 96%   BMI 30.90 kg/m   Visual Acuity Right Eye Distance:   Left Eye Distance:   Bilateral Distance:    Right Eye Near:   Left Eye Near:    Bilateral Near:     Physical Exam Vitals and nursing note reviewed.  Constitutional:      General: She is not in acute distress.    Appearance: Normal appearance. She is well-developed. She is not ill-appearing or toxic-appearing.  HENT:     Head: Normocephalic and atraumatic.     Right Ear: Tympanic membrane, ear canal and external ear normal.     Left Ear: Tympanic membrane, ear canal and external ear normal.     Nose: Nose normal.     Mouth/Throat:     Mouth: Mucous membranes are moist.     Pharynx: No oropharyngeal exudate or posterior oropharyngeal erythema.  Eyes:     Extraocular Movements: Extraocular movements intact.     Conjunctiva/sclera: Conjunctivae normal.     Pupils: Pupils are equal, round, and reactive to light.  Cardiovascular:     Rate and Rhythm: Normal rate and regular rhythm.     Pulses: Normal pulses.     Heart sounds: No murmur heard. Pulmonary:     Effort: Pulmonary effort is normal. No respiratory distress.     Breath sounds: Normal breath sounds. No stridor.  Musculoskeletal:        General: No swelling.     Cervical back: Normal  range of motion and neck supple. No rigidity or tenderness.  Lymphadenopathy:     Cervical: No cervical adenopathy.  Skin:    General: Skin is warm and dry.     Capillary Refill: Capillary refill takes less than 2 seconds.     Findings: No rash.  Neurological:     General: No focal deficit present.     Mental Status: She is alert and oriented to person, place, and time. Mental status is at baseline.     Cranial Nerves: Cranial nerves 2-12 are intact. No cranial nerve deficit or facial asymmetry.     Sensory: Sensation is intact.     Motor: Motor function is intact. No weakness, tremor, atrophy or pronator drift.     Coordination: Coordination is intact. Romberg sign negative. Coordination normal. Finger-Nose-Finger Test normal.     Gait: Gait is intact.     Deep Tendon Reflexes: Reflexes are normal and symmetric.  Psychiatric:        Mood and Affect: Mood normal.      UC Treatments / Results  Labs (all labs ordered are listed, but only abnormal results are displayed) Labs Reviewed - No data to display  EKG   Radiology No results found.  Procedures Procedures (including critical care time)  Medications Ordered in UC Medications  ketorolac (TORADOL) injection 60 mg (60 mg Intramuscular Given 11/30/21 1926)  metoCLOPramide (REGLAN) injection 10 mg (10 mg Intramuscular Given 11/30/21 1927)    Initial Impression / Assessment and Plan / UC Course  I have reviewed the triage vital signs and the nursing notes.  Pertinent labs & imaging results that were available during my care of the patient were reviewed by me and considered in my medical decision making (see chart for details).     Migraine with status migrainosus -patient does follow with neurology.  Is hoping to get an IV infusion on Monday if symptoms still present.  Patient has tolerated and had good success with Toradol and Reglan in the past.  Initiated in office today.  ER precautions discussed with  patient.   Final Clinical Impressions(s) / UC Diagnoses   Final diagnoses:  Migraine without aura and with status migrainosus, not intractable     Discharge Instructions      You were given a Reglan and Toradol injection today. Please follow-up with your neurologist on Monday. If your symptoms persist or do not respond to these injections, please head to the ER.       ED Prescriptions   None    PDMP not reviewed this encounter.   Chaney Malling, [place] 11/30/21 1928

## 2021-11-30 NOTE — Discharge Instructions (Addendum)
You were given a Reglan and Toradol injection today. Please follow-up with your neurologist on Monday. If your symptoms persist or do not respond to these injections, please head to the ER.

## 2021-11-30 NOTE — ED Triage Notes (Signed)
Patient c/o migraine x 5 days, nausea, no vomiting.  Patient does have a history of HA.  Patient has taken Imitrex, Promethazine and Ibuprofen yesterday, nothing today.  Requesting Toradol and Reglan injections.

## 2021-12-14 ENCOUNTER — Ambulatory Visit
Admission: EM | Admit: 2021-12-14 | Discharge: 2021-12-14 | Disposition: A | Discharge status: Home / Self Care | Payer: BC Managed Care – PPO | Attending: Family Medicine | Admitting: Family Medicine

## 2021-12-14 DIAGNOSIS — R519 Headache, unspecified: Secondary | ICD-10-CM | POA: Diagnosis not present

## 2021-12-14 MED ORDER — KETOROLAC TROMETHAMINE 60 MG/2ML IM SOLN
60.0000 mg | Freq: Once | INTRAMUSCULAR | Status: AC
  Administered 2021-12-14: 60 mg via INTRAMUSCULAR

## 2021-12-14 MED ORDER — METOCLOPRAMIDE HCL 5 MG/ML IJ SOLN
10.0000 mg | INTRAMUSCULAR | Status: AC
  Administered 2021-12-14: 10 mg via INTRAMUSCULAR

## 2021-12-14 NOTE — ED Triage Notes (Signed)
Pt c/o migraine since Sun night. Hx of migraines. Seen for this problem 3x in last couples months. Also sees neurology. Taking imitrex, phenergan and ibuprofen prn.

## 2021-12-14 NOTE — Discharge Instructions (Addendum)
Advised patient if symptoms worsen and/or unresolved please follow-up with PCP or here for further evaluation.  Encouraged patient increase daily water intake.

## 2021-12-14 NOTE — ED Provider Notes (Signed)
[name]    CSN: [id number] Arrival date & time: 12/14/21  1537      History   Chief Complaint Chief Complaint  Patient presents with   Headache    HPI Beverly Shepherd is a 51 y.o. female.   HPI 52 year old female presents with headache.  Reports dull pounding at the left frontal today.  Patient reports is followed by neurology for migraines and is taking Imitrex, Phenergan and Ibuprofen as needed as well PMH significant for migraine, obesity, and IBS. Patient request work note today.  Past Medical History:  Diagnosis Date   Anxiety    GERD (gastroesophageal reflux disease)    IBS (irritable bowel syndrome)    Insomnia    Migraine    Palpitations     Patient Active Problem List   Diagnosis Date Noted   Small bowel obstruction (Learned) 07/23/2015   MIGRAINE, COMMON 11/29/2009   CERVICAL SPASM 11/29/2009    Past Surgical History:  Procedure Laterality Date   ABDOMINAL HYSTERECTOMY      OB History   No obstetric history on file.      Home Medications    Prior to Admission medications   Medication Sig Start Date End Date Taking? Authorizing Provider  ALPRAZolam Duanne Moron) 1 MG tablet Take 1 mg by mouth daily. 09/22/14   [provider]  aspirin EC 81 MG tablet Take 81 mg by mouth daily.    [provider]  Erenumab-aooe (AIMOVIG) 140 MG/ML SOAJ Aimovig Autoinjector 140 mg/mL subcutaneous auto-injector    [provider]  ibuprofen (ADVIL,MOTRIN) 200 MG tablet Take 800 mg by mouth daily as needed for headache or moderate pain.    [provider]  lithium carbonate 300 MG capsule Take 300 mg by mouth daily.     [provider]  loratadine (CLARITIN) 10 MG tablet Take 10 mg by mouth daily.    [provider]  mirtazapine (REMERON) 15 MG tablet Take 15 mg by mouth at bedtime.    [provider]  ondansetron (ZOFRAN-ODT) 4 MG disintegrating tablet Take 4 mg by mouth every 6 (six) hours as  needed for nausea or vomiting.  07/07/15   [provider]  PARoxetine (PAXIL) 20 MG tablet Take 50 mg by mouth daily.    [provider]  promethazine (PHENERGAN) 25 MG tablet Take 1 tablet (25 mg total) by mouth daily as needed for nausea or vomiting. 08/16/21   Eliezer Lofts, FNP  ranitidine (ZANTAC) 300 MG tablet Take 300 mg by mouth at bedtime.    [provider]  SUMAtriptan (IMITREX) 100 MG tablet Take 100 mg by mouth 2 (two) times daily as needed for migraine.  07/11/15   [provider]  valACYclovir (VALTREX) 1000 MG tablet Take 500 mg by mouth daily. 07/11/15   [provider]    Family History Family History  Problem Relation Age of Onset   Congestive Heart Failure Father    Diabetes Father    Hypertension Father    COPD Father    Healthy Mother     Social History Social History   Tobacco Use   Smoking status: Never   Smokeless tobacco: Never  Vaping Use   Vaping Use: Never used  Substance Use Topics   Alcohol use: Yes    Alcohol/week: 0.0 standard drinks of alcohol    Comment: occasionally   Drug use: No     Allergies   Codeine phosphate [codeine], Duloxetine, Prednisone, and Sertraline  Review of Systems Review of Systems  Neurological:  Positive for headaches.  All other systems reviewed and are negative.    Physical Exam Triage Vital Signs ED Triage Vitals  Enc Vitals Group     BP 12/14/21 1548 138/88     Pulse Rate 12/14/21 1548 (!) 112     Resp 12/14/21 1548 17     Temp 12/14/21 1548 98.4 F (36.9 C)     Temp Source 12/14/21 1548 Oral     SpO2 12/14/21 1548 96 %     Weight --      Height --      Head Circumference --      Peak Flow --      Pain Score 12/14/21 1549 7     Pain Loc --      Pain Edu? --      Excl. in GC? --    No data found.  Updated Vital Signs BP 138/88 (BP Location: Right Arm)   Pulse (!) 112   Temp 98.4 F (36.9 C) (Oral)   Resp 17   SpO2 96%    Physical Exam Vitals  and nursing note reviewed.  Constitutional:      General: She is not in acute distress.    Appearance: She is well-developed. She is obese. She is not ill-appearing.  HENT:     Head: Atraumatic.     Mouth/Throat:     Mouth: Mucous membranes are moist.     Pharynx: Oropharynx is clear.  Eyes:     General: No visual field deficit.    Extraocular Movements: Extraocular movements intact.     Pupils: Pupils are equal, round, and reactive to light.  Cardiovascular:     Rate and Rhythm: Normal rate and regular rhythm.     Heart sounds: Normal heart sounds. No murmur heard. Pulmonary:     Effort: Pulmonary effort is normal.     Breath sounds: Normal breath sounds. No wheezing, rhonchi or rales.  Abdominal:     General: Bowel sounds are normal.     Palpations: Abdomen is soft.  Musculoskeletal:        General: Normal range of motion.     Cervical back: Normal range of motion and neck supple.  Lymphadenopathy:     Cervical: No cervical adenopathy.  Skin:    General: Skin is warm and dry.  Neurological:     Mental Status: She is alert and oriented to person, place, and time.     Cranial Nerves: No cranial nerve deficit, dysarthria or facial asymmetry.     Motor: No weakness.     Coordination: Coordination normal.      UC Treatments / Results  Labs (all labs ordered are listed, but only abnormal results are displayed) Labs Reviewed - No data to display  EKG   Radiology No results found.  Procedures Procedures (including critical care time)  Medications Ordered in UC Medications  ketorolac (TORADOL) injection 60 mg (60 mg Intramuscular Given 12/14/21 1609)  metoCLOPramide (REGLAN) injection 10 mg (10 mg Intramuscular Given 12/14/21 1608)    Initial Impression / Assessment and Plan / UC Course  I have reviewed the triage vital signs and the nursing notes.  Pertinent labs & imaging results that were available during my care of the patient were reviewed by me and considered  in my medical decision making (see chart for details).     MDM: 1.  Bad Headache-IM Toradol 60 mg, IM Reglan 10 mg given  once in clinic prior to discharge. Advised patient if symptoms worsen and/or unresolved please follow-up with PCP or here for further evaluation.  Encouraged patient increase daily water intake.  Work note provided to patient prior to discharge per request.  Patient discharged home, hemodynamically stable. Final Clinical Impressions(s) / UC Diagnoses   Final diagnoses:  Bad headache     Discharge Instructions      Advised patient if symptoms worsen and/or unresolved please follow-up with PCP or here for further evaluation.  Encouraged patient increase daily water intake.     ED Prescriptions   None    PDMP not reviewed this encounter.   Trevor Iha, FNP 12/14/21 1612

## 2021-12-15 ENCOUNTER — Telehealth: Payer: Self-pay

## 2021-12-15 NOTE — Telephone Encounter (Signed)
LMTRC if any questions or concerns. 

## 2021-12-25 ENCOUNTER — Ambulatory Visit
Admission: EM | Admit: 2021-12-25 | Discharge: 2021-12-25 | Disposition: A | Discharge status: Home / Self Care | Payer: BC Managed Care – PPO | Attending: Family Medicine | Admitting: Family Medicine

## 2021-12-25 DIAGNOSIS — R519 Headache, unspecified: Secondary | ICD-10-CM | POA: Diagnosis not present

## 2021-12-25 MED ORDER — METOCLOPRAMIDE HCL 5 MG/ML IJ SOLN
10.0000 mg | INTRAMUSCULAR | Status: AC
  Administered 2021-12-25: 10 mg via INTRAMUSCULAR

## 2021-12-25 MED ORDER — KETOROLAC TROMETHAMINE 60 MG/2ML IM SOLN
60.0000 mg | Freq: Once | INTRAMUSCULAR | Status: AC
Start: 2021-12-25 — End: 2021-12-25
  Administered 2021-12-25: 60 mg via INTRAMUSCULAR

## 2021-12-25 NOTE — Discharge Instructions (Addendum)
Advised patient if symptoms worsen and/or unresolved please follow-up with PCP or here for further evaluation. 

## 2021-12-25 NOTE — ED Triage Notes (Signed)
Pt c/o migraine since last Wed. Hx of migraines. Taking imitrex, phenergan and ibuprofen. Is working to get [employer] approved by [employer]. Tried to call [employer] today but they could not get her in .

## 2021-12-25 NOTE — ED Provider Notes (Signed)
[facility]    CSN: [id number] Arrival date & time: 12/25/21  1857      History   Chief Complaint Chief Complaint  Patient presents with   Migraine    HPI Beverly Shepherd is a 51 y.o. female.   HPI 51 year old female presents with migraine for 1 week.  Reports history of migraines.  Reports taking Imitrex, Phenergan and Ibuprofen. Patient reports is working with getting [employer] to get approved by insurance.  PMH significant for obesity, migraine, and IBS.  Past Medical History:  Diagnosis Date   Anxiety    GERD (gastroesophageal reflux disease)    IBS (irritable bowel syndrome)    Insomnia    Migraine    Palpitations     Patient Active Problem List   Diagnosis Date Noted   Small bowel obstruction (HCC) 07/23/2015   MIGRAINE, COMMON 11/29/2009   CERVICAL SPASM 11/29/2009    Past Surgical History:  Procedure Laterality Date   ABDOMINAL HYSTERECTOMY      OB History   No obstetric history on file.      Home Medications    Prior to Admission medications   Medication Sig Start Date End Date Taking? Authorizing Provider  ALPRAZolam Prudy Feeler) 1 MG tablet Take 1 mg by mouth daily. 09/22/14   [provider]  aspirin EC 81 MG tablet Take 81 mg by mouth daily.    [provider]  Erenumab-aooe (AIMOVIG) 140 MG/ML SOAJ Aimovig Autoinjector 140 mg/mL subcutaneous auto-injector    [provider]  ibuprofen (ADVIL,MOTRIN) 200 MG tablet Take 800 mg by mouth daily as needed for headache or moderate pain.    [provider]  lithium carbonate 300 MG capsule Take 300 mg by mouth daily.     [provider]  loratadine (CLARITIN) 10 MG tablet Take 10 mg by mouth daily.    [provider]  mirtazapine (REMERON) 15 MG tablet Take 15 mg by mouth at bedtime.    [provider]  ondansetron (ZOFRAN-ODT) 4 MG disintegrating tablet Take 4 mg by mouth every 6 (six) hours as needed for nausea or vomiting.   07/07/15   [provider]  PARoxetine (PAXIL) 20 MG tablet Take 50 mg by mouth daily.    [provider]  promethazine (PHENERGAN) 25 MG tablet Take 1 tablet (25 mg total) by mouth daily as needed for nausea or vomiting. 08/16/21   Trevor Iha, FNP  ranitidine (ZANTAC) 300 MG tablet Take 300 mg by mouth at bedtime.    [provider]  SUMAtriptan (IMITREX) 100 MG tablet Take 100 mg by mouth 2 (two) times daily as needed for migraine.  07/11/15   [provider]  valACYclovir (VALTREX) 1000 MG tablet Take 500 mg by mouth daily. 07/11/15   [provider]    Family History Family History  Problem Relation Age of Onset   Congestive Heart Failure Father    Diabetes Father    Hypertension Father    COPD Father    Healthy Mother     Social History Social History   Tobacco Use   Smoking status: Never   Smokeless tobacco: Never  Vaping Use   Vaping Use: Never used  Substance Use Topics   Alcohol use: Yes    Alcohol/week: 0.0 standard drinks of alcohol    Comment: occasionally   Drug use: No     Allergies   Codeine phosphate [codeine], Duloxetine, Prednisone, and Sertraline   Review of Systems Review of  Systems  Neurological:  Positive for headaches.     Physical Exam Triage Vital Signs ED Triage Vitals  Enc Vitals Group     BP 12/25/21 1907 123/83     Pulse Rate 12/25/21 1907 90     Resp 12/25/21 1907 17     Temp 12/25/21 1907 97.9 F (36.6 C)     Temp Source 12/25/21 1907 Oral     SpO2 12/25/21 1907 98 %     Weight --      Height --      Head Circumference --      Peak Flow --      Pain Score 12/25/21 1908 6     Pain Loc --      Pain Edu? --      Excl. in GC? --    No data found.  Updated Vital Signs BP 123/83 (BP Location: Right Arm)   Pulse 90   Temp 97.9 F (36.6 C) (Oral)   Resp 17   SpO2 98%    Physical Exam Vitals and nursing note reviewed.  Constitutional:      General: She is not in acute  distress.    Appearance: Normal appearance. She is obese. She is ill-appearing.  HENT:     Head: Normocephalic and atraumatic.     Right Ear: Tympanic membrane, ear canal and external ear normal.     Left Ear: Tympanic membrane, ear canal and external ear normal.     Mouth/Throat:     Mouth: Mucous membranes are moist.     Pharynx: Oropharynx is clear.  Eyes:     Extraocular Movements: Extraocular movements intact.     Conjunctiva/sclera: Conjunctivae normal.     Pupils: Pupils are equal, round, and reactive to light.  Cardiovascular:     Rate and Rhythm: Normal rate and regular rhythm.     Pulses: Normal pulses.     Heart sounds: Normal heart sounds.  Pulmonary:     Effort: Pulmonary effort is normal.     Breath sounds: Normal breath sounds. No wheezing, rhonchi or rales.  Musculoskeletal:        General: Normal range of motion.     Cervical back: Normal range of motion and neck supple.  Skin:    General: Skin is warm and dry.  Neurological:     General: No focal deficit present.     Mental Status: She is alert and oriented to person, place, and time.      UC Treatments / Results  Labs (all labs ordered are listed, but only abnormal results are displayed) Labs Reviewed - No data to display  EKG   Radiology No results found.  Procedures Procedures (including critical care time)  Medications Ordered in UC Medications  ketorolac (TORADOL) injection 60 mg (has no administration in time range)  metoCLOPramide (REGLAN) injection 10 mg (has no administration in time range)    Initial Impression / Assessment and Plan / UC Course  I have reviewed the triage vital signs and the nursing notes.  Pertinent labs & imaging results that were available during my care of the patient were reviewed by me and considered in my medical decision making (see chart for details).     MDM: 1.  Bad headache-IM Toradol, IM Reglan given once and clinic. Advised patient if symptoms worsen  and/or unresolved please follow-up with PCP or here for further evaluation.  Final Clinical Impressions(s) / UC Diagnoses   Final diagnoses:  Bad headache  Discharge Instructions      Advised patient if symptoms worsen and/or unresolved please follow-up with PCP or here for further evaluation.     ED Prescriptions   None    PDMP not reviewed this encounter.   Eliezer Lofts, [place] 12/25/21 1930

## 2022-02-15 ENCOUNTER — Encounter: Payer: Self-pay | Admitting: Emergency Medicine

## 2022-02-15 ENCOUNTER — Ambulatory Visit
Admission: EM | Admit: 2022-02-15 | Discharge: 2022-02-15 | Disposition: A | Discharge status: Home / Self Care | Payer: BC Managed Care – PPO | Attending: Family Medicine | Admitting: Family Medicine

## 2022-02-15 DIAGNOSIS — G43001 Migraine without aura, not intractable, with status migrainosus: Secondary | ICD-10-CM

## 2022-02-15 DIAGNOSIS — R197 Diarrhea, unspecified: Secondary | ICD-10-CM

## 2022-02-15 MED ORDER — KETOROLAC TROMETHAMINE 60 MG/2ML IM SOLN
60.0000 mg | Freq: Once | INTRAMUSCULAR | Status: AC
  Administered 2022-02-15: 60 mg via INTRAMUSCULAR

## 2022-02-15 MED ORDER — METOCLOPRAMIDE HCL 5 MG/ML IJ SOLN
10.0000 mg | Freq: Once | INTRAMUSCULAR | Status: AC
  Administered 2022-02-15: 10 mg via INTRAMUSCULAR

## 2022-02-15 NOTE — ED Triage Notes (Signed)
Patient c/o migraine x 3 days.  Patient has developed diarrhea and increased dry heving as well.  Patient has taken Imitrex, Imodium, Dicyclomine and Phenergan.

## 2022-02-15 NOTE — ED Provider Notes (Signed)
[facility]    CSN: [id number] Arrival date & time: 02/15/22  1328      History   Chief Complaint Chief Complaint  Patient presents with   Migraine    HPI Beverly Shepherd is a 51 y.o. female.   Patient has a history of migraine headaches without aura.  About 3 days ago she developed a typical right side headache as well as nausea/vomiting ("dry heaves") and diarrhea.  She denies abdominal pain, fevers, chills, and sweats, and other neurologic symptoms.  The history is provided by the patient.    Past Medical History:  Diagnosis Date   Anxiety    GERD (gastroesophageal reflux disease)    IBS (irritable bowel syndrome)    Insomnia    Migraine    Palpitations     Patient Active Problem List   Diagnosis Date Noted   Small bowel obstruction (HCC) 07/23/2015   MIGRAINE, COMMON 11/29/2009   CERVICAL SPASM 11/29/2009    Past Surgical History:  Procedure Laterality Date   ABDOMINAL HYSTERECTOMY      OB History   No obstetric history on file.      Home Medications    Prior to Admission medications   Medication Sig Start Date End Date Taking? Authorizing Provider  ALPRAZolam Prudy Feeler) 1 MG tablet Take 1 mg by mouth daily. 09/22/14  Yes [provider]  aspirin EC 81 MG tablet Take 81 mg by mouth daily.   Yes [provider]  Erenumab-aooe (AIMOVIG) 140 MG/ML SOAJ Aimovig Autoinjector 140 mg/mL subcutaneous auto-injector   Yes [provider]  ibuprofen (ADVIL,MOTRIN) 200 MG tablet Take 800 mg by mouth daily as needed for headache or moderate pain.   Yes [provider]  lithium carbonate 300 MG capsule Take 300 mg by mouth daily.    Yes [provider]  loratadine (CLARITIN) 10 MG tablet Take 10 mg by mouth daily.   Yes [provider]  mirtazapine (REMERON) 15 MG tablet Take 15 mg by mouth at bedtime.   Yes [provider]  ondansetron (ZOFRAN-ODT) 4 MG disintegrating tablet Take 4 mg by  mouth every 6 (six) hours as needed for nausea or vomiting.  07/07/15  Yes [provider]  PARoxetine (PAXIL) 20 MG tablet Take 50 mg by mouth daily.   Yes [provider]  promethazine (PHENERGAN) 25 MG tablet Take 1 tablet (25 mg total) by mouth daily as needed for nausea or vomiting. 08/16/21  Yes Trevor Iha, FNP  ranitidine (ZANTAC) 300 MG tablet Take 300 mg by mouth at bedtime.   Yes [provider]  SUMAtriptan (IMITREX) 100 MG tablet Take 100 mg by mouth 2 (two) times daily as needed for migraine.  07/11/15  Yes [provider]  valACYclovir (VALTREX) 1000 MG tablet Take 500 mg by mouth daily. 07/11/15  Yes [provider]    Family History Family History  Problem Relation Age of Onset   Congestive Heart Failure Father    Diabetes Father    Hypertension Father    COPD Father    Healthy Mother     Social History Social History   Tobacco Use   Smoking status: Never   Smokeless tobacco: Never  Vaping Use   Vaping Use: Never used  Substance Use Topics   Alcohol use: Yes    Alcohol/week: 0.0 standard drinks of alcohol    Comment: occasionally   Drug use: No     Allergies   Codeine phosphate [codeine],  Duloxetine, Prednisone, and Sertraline   Review of Systems Review of Systems No sore throat No cough No pleuritic pain No wheezing No nasal congestion No post-nasal drainage No sinus pain/pressure No itchy/red eyes No earache No hemoptysis No SOB No fever/chills + nausea + vomiting No abdominal pain + diarrhea No urinary symptoms No skin rash No fatigue No myalgias + headache without preceding aura  Physical Exam Triage Vital Signs ED Triage Vitals  Enc Vitals Group     BP 02/15/22 1340 125/81     Pulse Rate 02/15/22 1340 (!) 112     Resp 02/15/22 1340 18     Temp 02/15/22 1340 98.7 F (37.1 C)     Temp Source 02/15/22 1340 Oral     SpO2 02/15/22 1340 95 %     Weight 02/15/22 1342 182 lb (82.6 kg)      Height 02/15/22 1342 5\' 4"  (1.626 m)     Head Circumference --      Peak Flow --      Pain Score 02/15/22 1342 7     Pain Loc --      Pain Edu? --      Excl. in GC? --    No data found.  Updated Vital Signs BP 125/81 (BP Location: Right Arm)   Pulse (!) 112   Temp 98.7 F (37.1 C) (Oral)   Resp 18   Ht 5\' 4"  (1.626 m)   Wt 82.6 kg   SpO2 95%   BMI 31.24 kg/m   Visual Acuity Right Eye Distance:   Left Eye Distance:   Bilateral Distance:    Right Eye Near:   Left Eye Near:    Bilateral Near:     Physical Exam Nursing notes and Vital Signs reviewed. Appearance:  Patient appears stated age, and in no acute distress Eyes:  Pupils are equal, round, and reactive to light and accomodation.  Extraocular movement is intact.  Conjunctivae are not inflamed.  Fundi benign.  Mild photophobia present.  Ears:  Canals normal.  Tympanic membranes normal.  Nose: Normal turbinates.  No sinus tenderness.  Pharynx:  Normal; moist mucous membranes. Neck:  Supple. No adenopathy. Lungs:  Clear to auscultation.  Breath sounds are equal.  Moving air well. Heart:  Regular rate and rhythm without murmurs, rubs, or gallops. Rate 112. Abdomen:  Nontender without masses or hepatosplenomegaly.  Bowel sounds are present.  No CVA or flank tenderness.  Extremities:  No edema.  Skin:  No rash present.  Neurologic:  Cranial nerves 2 through 12 are normal.  Patellar, achilles, and elbow reflexes are normal.  Cerebellar function is intact (finger-to-nose and rapid alternating hand movement).  No pronator drift.  UC Treatments / Results  Labs (all labs ordered are listed, but only abnormal results are displayed) Labs Reviewed - No data to display  EKG   Radiology No results found.  Procedures Procedures (including critical care time)  Medications Ordered in UC Medications  ketorolac (TORADOL) injection 60 mg (has no administration in time range)  metoCLOPramide (REGLAN) injection 10 mg (has no  administration in time range)    Initial Impression / Assessment and Plan / UC Course  I have reviewed the triage vital signs and the nursing notes.  Pertinent labs & imaging results that were available during my care of the patient were reviewed by me and considered in my medical decision making (see chart for details).    Benign exam.  Suspect a concurrent viral gastroenteritis. Administered Toradol  60mg  IM and Reglan 10mg  IM. Followup with neurologist if not improved one week.  Final Clinical Impressions(s) / UC Diagnoses   Final diagnoses:  Migraine without aura and with status migrainosus, not intractable  Diarrhea, unspecified type     Discharge Instructions      Begin Pedialyte for about 6 to 8 hours until diarrhea decreases, then switch to clear liquids (apple juice, clear grape juice, Jello, etc) for about 12 hours.  When improved, advance to a (Bananas, Rice, Applesauce, Toast). Then gradually resume a regular diet when tolerated.  Avoid milk products until well.    If symptoms become significantly worse during the night or over the weekend, proceed to the local emergency room.     ED Prescriptions   None       , MD 02/17/22 1456

## 2022-02-15 NOTE — Discharge Instructions (Addendum)
Begin Pedialyte for about 6 to 8 hours until diarrhea decreases, then switch to clear liquids (apple juice, clear grape juice, Jello, etc) for about 12 hours.  When improved, advance to a [employer] (Bananas, Rice, Applesauce, Toast). Then gradually resume a regular diet when tolerated.  Avoid milk products until well.    If symptoms become significantly worse during the night or over the weekend, proceed to the local emergency room.

## 2022-03-12 ENCOUNTER — Ambulatory Visit
Admission: EM | Admit: 2022-03-12 | Discharge: 2022-03-12 | Disposition: A | Discharge status: Home / Self Care | Payer: BC Managed Care – PPO

## 2022-03-12 DIAGNOSIS — J069 Acute upper respiratory infection, unspecified: Secondary | ICD-10-CM | POA: Diagnosis not present

## 2022-03-12 LAB — POCT INFLUENZA A/B
Influenza A, POC: NEGATIVE
Influenza B, POC: NEGATIVE

## 2022-03-12 LAB — POC SARS CORONAVIRUS 2 AG -  ED: SARS Coronavirus 2 Ag: NEGATIVE

## 2022-03-12 MED ORDER — PROMETHAZINE-DM 6.25-15 MG/5ML PO SYRP: 5.0000 mL | ORAL_SOLUTION | Freq: Four times a day (QID) | ORAL | 0 refills | Status: DC | PRN

## 2022-03-12 NOTE — ED Provider Notes (Signed)
[name]    CSN: [id number] Arrival date & time: 03/12/22  1301      History   Chief Complaint Chief Complaint  Patient presents with   Cough    HPI Beverly Shepherd is a 52 y.o. female.   HPI Is the third day of illness with fever and fatigue.  Feeling very rundown.  Cough and congestion.  States she cannot sleep because of the achiness, congestion, and cough.  No known exposure to illness.  No one else at home or work is sick.  Past Medical History:  Diagnosis Date   Anxiety    GERD (gastroesophageal reflux disease)    IBS (irritable bowel syndrome)    Insomnia    Migraine    Palpitations     Patient Active Problem List   Diagnosis Date Noted   Small bowel obstruction ([place]) 07/23/2015   MIGRAINE, COMMON 11/29/2009   CERVICAL SPASM 11/29/2009    Past Surgical History:  Procedure Laterality Date   ABDOMINAL HYSTERECTOMY      OB History   No obstetric history on file.      Home Medications    Prior to Admission medications   Medication Sig Start Date End Date Taking? Authorizing Provider  Galcanezumab-gnlm [facility]) Inject into the skin.   Yes [provider]  lamoTRIgine (LAMICTAL) 25 MG tablet Take 25 mg by mouth daily.   Yes [provider]  omeprazole (PRILOSEC) 20 MG capsule Take 20 mg by mouth daily.   Yes [provider]  promethazine-dextromethorphan (PROMETHAZINE-DM) 6.25-15 MG/5ML syrup Take 5 mLs by mouth 4 (four) times daily as needed for cough. 03/12/22  Yes Raylene Everts, MD  ALPRAZolam Duanne Moron) 1 MG tablet Take 1 mg by mouth daily. 09/22/14   [provider]  aspirin EC 81 MG tablet Take 81 mg by mouth daily.    [provider]  ibuprofen (ADVIL,MOTRIN) 200 MG tablet Take 800 mg by mouth daily as needed for headache or moderate pain.    [provider]  lithium carbonate 300 MG capsule Take 300 mg by mouth daily.     [provider]  loratadine (CLARITIN) 10  MG tablet Take 10 mg by mouth daily.    [provider]  mirtazapine (REMERON) 15 MG tablet Take 15 mg by mouth at bedtime.    [provider]  PARoxetine (PAXIL) 20 MG tablet Take 50 mg by mouth daily.    [provider]  ranitidine (ZANTAC) 300 MG tablet Take 300 mg by mouth at bedtime. Patient not taking: Reported on 03/12/2022    [provider]  SUMAtriptan (IMITREX) 100 MG tablet Take 100 mg by mouth 2 (two) times daily as needed for migraine.  07/11/15   [provider]  valACYclovir (VALTREX) 1000 MG tablet Take 500 mg by mouth daily. 07/11/15   [provider]    Family History Family History  Problem Relation Age of Onset   Congestive Heart Failure Father    Diabetes Father    Hypertension Father    COPD Father    Healthy Mother     Social History Social History   Tobacco Use   Smoking status: Never   Smokeless tobacco: Never  Vaping Use   Vaping Use: Never used  Substance Use Topics   Alcohol use: Yes    Alcohol/week: 0.0 standard drinks of alcohol    Comment: occasionally   Drug use: No     Allergies  Codeine phosphate [codeine], Duloxetine, Prednisone, and Sertraline   Review of Systems Review of Systems See HPI  Physical Exam Triage Vital Signs ED Triage Vitals  Enc Vitals Group     BP 03/12/22 1309 118/84     Pulse Rate 03/12/22 1309 (!) 112     Resp 03/12/22 1309 14     Temp 03/12/22 1309 98.6 F (37 C)     Temp Source 03/12/22 1309 Oral     SpO2 03/12/22 1309 95 %     Weight --      Height --      Head Circumference --      Peak Flow --      Pain Score 03/12/22 1307 0     Pain Loc --      Pain Edu? --      Excl. in [place]? --    No data found.  Updated Vital Signs BP 118/84 (BP Location: Right Arm)   Pulse (!) 112   Temp 98.6 F (37 C) (Oral)   Resp 14   SpO2 95%       Physical Exam Constitutional:      General: She is not in acute distress.    Appearance: She is well-developed.  She is obese. She is ill-appearing.  HENT:     Head: Normocephalic and atraumatic.     Right Ear: Tympanic membrane normal.     Left Ear: Tympanic membrane normal.     Nose: Rhinorrhea present.     Mouth/Throat:     Mouth: Mucous membranes are moist.     Pharynx: Posterior oropharyngeal erythema present.  Eyes:     Conjunctiva/sclera: Conjunctivae normal.     Pupils: Pupils are equal, round, and reactive to light.  Cardiovascular:     Rate and Rhythm: Normal rate.     Heart sounds: Normal heart sounds.  Pulmonary:     Effort: Pulmonary effort is normal. No respiratory distress.     Breath sounds: Normal breath sounds.  Abdominal:     General: There is no distension.     Palpations: Abdomen is soft.  Musculoskeletal:        General: Normal range of motion.     Cervical back: Normal range of motion.  Skin:    General: Skin is warm and dry.  Neurological:     Mental Status: She is alert.      UC Treatments / Results  Labs (all labs ordered are listed, but only abnormal results are displayed) Labs Reviewed  POC SARS CORONAVIRUS 2 AG -  ED  POCT INFLUENZA A/B    EKG   Radiology No results found.  Procedures Procedures (including critical care time)  Medications Ordered in UC Medications - No data to display  Initial Impression / Assessment and Plan / UC Course  I have reviewed the triage vital signs and the nursing notes.  Pertinent labs & imaging results that were available during my care of the patient were reviewed by me and considered in my medical decision making (see chart for details).     Viral illness discussed.  Symptomatic care.  Prescription cough medicine is given because of exhaustion and coughing at night Final Clinical Impressions(s) / UC Diagnoses   Final diagnoses:  Viral URI with cough     Discharge Instructions      You have a viral respiratory illness.  Antibiotics will not help Flu and COVID tests are negative Drink lots of  water May take over the  counter cough and cold medicine as needed I have prescribed a stronger cough medicine as needed.  This is helpful at night.  It may cause drowsiness See your doctor if not improving by next week     ED Prescriptions     Medication Sig Dispense Auth. Provider   promethazine-dextromethorphan (PROMETHAZINE-DM) 6.25-15 MG/5ML syrup Take 5 mLs by mouth 4 (four) times daily as needed for cough. 118 mL Raylene Everts, MD      PDMP not reviewed this encounter.   Raylene Everts, MD 03/12/22 2115

## 2022-03-12 NOTE — ED Triage Notes (Signed)
Pt presents with c/o nasal congestion, cough, and decreased sleep that began saturday

## 2022-03-12 NOTE — Discharge Instructions (Addendum)
You have a viral respiratory illness.  Antibiotics will not help Flu and COVID tests are negative Drink lots of water May take over the counter cough and cold medicine as needed I have prescribed a stronger cough medicine as needed.  This is helpful at night.  It may cause drowsiness See your doctor if not improving by next week

## 2022-05-09 ENCOUNTER — Ambulatory Visit
Admission: EM | Admit: 2022-05-09 | Discharge: 2022-05-09 | Disposition: A | Discharge status: Home / Self Care | Payer: BC Managed Care – PPO

## 2022-05-09 DIAGNOSIS — H6192 Disorder of left external ear, unspecified: Secondary | ICD-10-CM | POA: Diagnosis not present

## 2022-05-09 MED ORDER — MUPIROCIN CALCIUM 2 % EX CREA: 1.0000 | TOPICAL_CREAM | Freq: Two times a day (BID) | CUTANEOUS | 0 refills | Status: DC

## 2022-05-09 MED ORDER — MUPIROCIN 2 % EX OINT: 1.0000 | TOPICAL_OINTMENT | Freq: Two times a day (BID) | CUTANEOUS | 0 refills | Status: DC

## 2022-05-09 NOTE — ED Triage Notes (Signed)
Pt presents with continued left ear pain and swelling

## 2022-05-09 NOTE — ED Provider Notes (Signed)
[name]    CSN: [id number] Arrival date & time: 05/09/22  1537     History   Chief Complaint Chief Complaint  Patient presents with   Otalgia    HPI Beverly Shepherd is a 52 y.o. female.  Here with left ear pain Saw PCP on 2/9, dx with otitis externa and started on drops She was seen again 2/14 and changed to oral amox and started on flonase Those symptoms have since resolved 2 days ago developed pain on the outside of left ear Pain today is 4/10  Past Medical History:  Diagnosis Date   Anxiety    GERD (gastroesophageal reflux disease)    IBS (irritable bowel syndrome)    Insomnia    Migraine    Palpitations     Patient Active Problem List   Diagnosis Date Noted   Small bowel obstruction ([place]) 07/23/2015   MIGRAINE, COMMON 11/29/2009   CERVICAL SPASM 11/29/2009    Past Surgical History:  Procedure Laterality Date   ABDOMINAL HYSTERECTOMY      OB History   No obstetric history on file.      Home Medications    Prior to Admission medications   Medication Sig Start Date End Date Taking? Authorizing Provider  mupirocin cream (BACTROBAN) 2 % Apply 1 Application topically 2 (two) times daily. 05/09/22  Yes Jisselle Poth, PA-C  Suvorexant (BELSOMRA) 10 MG TABS Take by mouth.   Yes [provider]  ALPRAZolam Duanne Moron) 1 MG tablet Take 1 mg by mouth daily. 09/22/14   [provider]  aspirin EC 81 MG tablet Take 81 mg by mouth daily.    [provider]  Galcanezumab-gnlm [facility]) Inject into the skin.    [provider]  ibuprofen (ADVIL,MOTRIN) 200 MG tablet Take 800 mg by mouth daily as needed for headache or moderate pain.    [provider]  lamoTRIgine (LAMICTAL) 25 MG tablet Take 25 mg by mouth daily.    [provider]  lithium carbonate 300 MG capsule Take 300 mg by mouth daily.     [provider]  loratadine (CLARITIN) 10 MG tablet Take 10 mg by mouth daily.     [provider]  mirtazapine (REMERON) 15 MG tablet Take 15 mg by mouth at bedtime.    [provider]  omeprazole (PRILOSEC) 20 MG capsule Take 20 mg by mouth daily.    [provider]  PARoxetine (PAXIL) 20 MG tablet Take 50 mg by mouth daily.    [provider]  promethazine-dextromethorphan (PROMETHAZINE-DM) 6.25-15 MG/5ML syrup Take 5 mLs by mouth 4 (four) times daily as needed for cough. 03/12/22   Raylene Everts, MD  ranitidine (ZANTAC) 300 MG tablet Take 300 mg by mouth at bedtime. Patient not taking: Reported on 03/12/2022    [provider]  SUMAtriptan (IMITREX) 100 MG tablet Take 100 mg by mouth 2 (two) times daily as needed for migraine.  07/11/15   [provider]  valACYclovir (VALTREX) 1000 MG tablet Take 500 mg by mouth daily. 07/11/15   [provider]    Family History Family History  Problem Relation Age of Onset   Congestive Heart Failure Father    Diabetes Father    Hypertension Father    COPD Father    Healthy Mother     Social History Social History   Tobacco Use   Smoking status: Never   Smokeless tobacco: Never  Vaping Use   Vaping Use:  Never used  Substance Use Topics   Alcohol use: Yes    Alcohol/week: 0.0 standard drinks of alcohol    Comment: occasionally   Drug use: No     Allergies   Codeine phosphate [codeine], Duloxetine, Prednisone, and Sertraline   Review of Systems Review of Systems  HENT:  Positive for ear pain.    As per HPI  Physical Exam Triage Vital Signs ED Triage Vitals [05/09/22 1543]  Enc Vitals Group     BP      Pulse      Resp      Temp      Temp src      SpO2      Weight      Height      Head Circumference      Peak Flow      Pain Score 4     Pain Loc      Pain Edu?      Excl. in [place]?    No data found.  Updated Vital Signs Pulse 77   Temp 98 F (36.7 C) (Oral)   Resp 12   SpO2 95%     Physical Exam Vitals and nursing note reviewed.   Constitutional:      General: She is not in acute distress. HENT:     Right Ear: Tympanic membrane, ear canal and external ear normal.     Left Ear: Tympanic membrane and ear canal normal.     Ears:      Comments: Small red bump at external left ear. Erythematous. Tender. Not fluctuant or indurated. Canal and TM are normal Cardiovascular:     Rate and Rhythm: Normal rate and regular rhythm.  Pulmonary:     Effort: Pulmonary effort is normal.  Musculoskeletal:     Cervical back: Normal range of motion.  Neurological:     Mental Status: She is alert and oriented to person, place, and time.     UC Treatments / Results  Labs (all labs ordered are listed, but only abnormal results are displayed) Labs Reviewed - No data to display  EKG  Radiology No results found.  Procedures Procedures   Medications Ordered in UC Medications - No data to display  Initial Impression / Assessment and Plan / UC Course  I have reviewed the triage vital signs and the nursing notes.  Pertinent labs & imaging results that were available during my care of the patient were reviewed by me and considered in my medical decision making (see chart for details).  Small red bump at external left ear. Not an abscess but area is erythematous and tender. Will try Bactroban BID, ibuprofen/tylenol for pain and inflammation Provided clinic info for ENT follow up if needed Otherwise she can follow here or with PCP Patient agreeable to plan  Final Clinical Impressions(s) / UC Diagnoses   Final diagnoses:  Disorder of left external ear     Discharge Instructions      Bactroban twice daily to the external left ear Use tylenol or ibuprofen for pain/inflammation  You can call the ENT office to make an appointment if needed Otherwise I recommend follow up with your primary care     ED Prescriptions     Medication Sig Dispense Auth. Provider   mupirocin cream (BACTROBAN) 2 % Apply 1 Application  topically 2 (two) times daily. 15 g Tonnia Bardin, Wells Guiles, PA-C      PDMP not reviewed this encounter.   Keajah Killough, Vernice Jefferson 05/09/22  1603  

## 2022-05-09 NOTE — Discharge Instructions (Addendum)
Bactroban twice daily to the external left ear Use tylenol or ibuprofen for pain/inflammation  You can call the ENT office to make an appointment if needed Otherwise I recommend follow up with your primary care

## 2022-07-03 ENCOUNTER — Encounter: Payer: Self-pay | Admitting: Family Medicine

## 2022-07-03 ENCOUNTER — Ambulatory Visit
Admission: EM | Admit: 2022-07-03 | Discharge: 2022-07-03 | Disposition: A | Discharge status: Home / Self Care | Payer: BC Managed Care – PPO | Attending: Family Medicine | Admitting: Family Medicine

## 2022-07-03 DIAGNOSIS — R519 Headache, unspecified: Secondary | ICD-10-CM | POA: Diagnosis not present

## 2022-07-03 MED ORDER — METOCLOPRAMIDE HCL 5 MG/ML IJ SOLN
10.0000 mg | INTRAMUSCULAR | Status: AC
  Administered 2022-07-03: 10 mg via INTRAMUSCULAR

## 2022-07-03 MED ORDER — KETOROLAC TROMETHAMINE 60 MG/2ML IM SOLN
60.0000 mg | Freq: Once | INTRAMUSCULAR | Status: AC
  Administered 2022-07-03: 60 mg via INTRAMUSCULAR

## 2022-07-03 NOTE — Discharge Instructions (Addendum)
Advised patient may take OTC extra strength Excedrin 1-2 tabs daily, as needed for headache.  Advised patient if headache worsens and/or unresolved please follow-up with PCP, neurology or here for further evaluation.

## 2022-07-03 NOTE — ED Triage Notes (Signed)
Pt c/o HA x 3 days. Hx of migraines. Tried imitrex with no relief.

## 2022-07-03 NOTE — ED Provider Notes (Signed)
[facility]    CSN: [id number] Arrival date & time: 07/03/22  1238      History   Chief Complaint Chief Complaint  Patient presents with   Headache    HPI RESHUNDA STRIDER is a 52 y.o. female.   HPI Pleasant 52 year old female presents with headache for 3 days.  Reports taking her Imitrex with little to no relief.  PMH significant for migraine, obesity, and IBS.  Past Medical History:  Diagnosis Date   Anxiety    GERD (gastroesophageal reflux disease)    IBS (irritable bowel syndrome)    Insomnia    Migraine    Palpitations     Patient Active Problem List   Diagnosis Date Noted   Small bowel obstruction 07/23/2015   MIGRAINE, COMMON 11/29/2009   CERVICAL SPASM 11/29/2009    Past Surgical History:  Procedure Laterality Date   ABDOMINAL HYSTERECTOMY      OB History   No obstetric history on file.      Home Medications    Prior to Admission medications   Medication Sig Start Date End Date Taking? Authorizing Provider  Semaglutide,0.25 or 0.5MG /DOS, (OZEMPIC, 0.25 OR 0.5 MG/DOSE,) 2 MG/1.5ML SOPN Inject into the skin.   Yes [provider]  ALPRAZolam Prudy Feeler) 1 MG tablet Take 1 mg by mouth daily. 09/22/14   [provider]  aspirin EC 81 MG tablet Take 81 mg by mouth daily.    [provider]  Galcanezumab-gnlm [facility]) Inject into the skin.    [provider]  ibuprofen (ADVIL,MOTRIN) 200 MG tablet Take 800 mg by mouth daily as needed for headache or moderate pain.    [provider]  lamoTRIgine (LAMICTAL) 25 MG tablet Take 25 mg by mouth daily.    [provider]  lithium carbonate 300 MG capsule Take 300 mg by mouth daily.     [provider]  loratadine (CLARITIN) 10 MG tablet Take 10 mg by mouth daily.    [provider]  mirtazapine (REMERON) 15 MG tablet Take 15 mg by mouth at bedtime.    [provider]  mupirocin ointment (BACTROBAN) 2 % Apply 1  Application topically 2 (two) times daily. 05/09/22   Rising, Lurena Joiner, PA-C  omeprazole (PRILOSEC) 20 MG capsule Take 20 mg by mouth daily.    [provider]  PARoxetine (PAXIL) 20 MG tablet Take 50 mg by mouth daily.    [provider]  promethazine-dextromethorphan (PROMETHAZINE-DM) 6.25-15 MG/5ML syrup Take 5 mLs by mouth 4 (four) times daily as needed for cough. 03/12/22   Eustace Moore, MD  ranitidine (ZANTAC) 300 MG tablet Take 300 mg by mouth at bedtime. Patient not taking: Reported on 03/12/2022    [provider]  SUMAtriptan (IMITREX) 100 MG tablet Take 100 mg by mouth 2 (two) times daily as needed for migraine.  07/11/15   [provider]  Suvorexant (BELSOMRA) 10 MG TABS Take by mouth.    [provider]  valACYclovir (VALTREX) 1000 MG tablet Take 500 mg by mouth daily. 07/11/15   [provider]    Family History Family History  Problem Relation Age of Onset   Congestive Heart Failure Father    Diabetes Father    Hypertension Father    COPD Father    Healthy Mother     Social History Social History   Tobacco Use   Smoking status: Never   Smokeless tobacco: Never  Vaping Use   Vaping Use:  Never used  Substance Use Topics   Alcohol use: Yes    Alcohol/week: 0.0 standard drinks of alcohol    Comment: occasionally   Drug use: No     Allergies   Codeine phosphate [codeine], Duloxetine, Prednisone, and Sertraline   Review of Systems Review of Systems  Neurological:  Positive for headaches.  All other systems reviewed and are negative.    Physical Exam Triage Vital Signs ED Triage Vitals  Enc Vitals Group     BP      Pulse      Resp      Temp      Temp src      SpO2      Weight      Height      Head Circumference      Peak Flow      Pain Score      Pain Loc      Pain Edu?      Excl. in [place]?    No data found.  Updated Vital Signs BP 117/79 (BP Location: Right Arm)   Pulse 94   Temp 97.7 F  (36.5 C) (Oral)   Resp 16   SpO2 96%   Visual Acuity Right Eye Distance:   Left Eye Distance:   Bilateral Distance:        Physical Exam Vitals and nursing note reviewed.  Constitutional:      Appearance: Normal appearance. She is obese.  HENT:     Head: Normocephalic and atraumatic.     Right Ear: Tympanic membrane, ear canal and external ear normal.     Left Ear: Tympanic membrane, ear canal and external ear normal.     Mouth/Throat:     Mouth: Mucous membranes are moist.     Pharynx: Oropharynx is clear.  Eyes:     Extraocular Movements: Extraocular movements intact.     Conjunctiva/sclera: Conjunctivae normal.     Pupils: Pupils are equal, round, and reactive to light.  Cardiovascular:     Rate and Rhythm: Normal rate and regular rhythm.     Pulses: Normal pulses.     Heart sounds: Normal heart sounds.  Pulmonary:     Effort: Pulmonary effort is normal.     Breath sounds: Normal breath sounds. No wheezing, rhonchi or rales.  Musculoskeletal:        General: Normal range of motion.     Cervical back: Normal range of motion and neck supple.  Skin:    General: Skin is warm and dry.  Neurological:     General: No focal deficit present.     Mental Status: She is alert and oriented to person, place, and time. Mental status is at baseline.  Psychiatric:        Mood and Affect: Mood normal.        Behavior: Behavior normal.        Thought Content: Thought content normal.      UC Treatments / Results  Labs (all labs ordered are listed, but only abnormal results are displayed) Labs Reviewed - No data to display  EKG   Radiology No results found.  Procedures Procedures (including critical care time)  Medications Ordered in UC Medications  ketorolac (TORADOL) injection 60 mg (60 mg Intramuscular Given 07/03/22 1302)  metoCLOPramide (REGLAN) injection 10 mg (10 mg Intramuscular Given 07/03/22 1302)    Initial Impression / Assessment and Plan / UC Course  I  have reviewed the triage vital signs and  the nursing notes.  Pertinent labs & imaging results that were available during my care of the patient were reviewed by me and considered in my medical decision making (see chart for details).     MDM: 1.  Bad headache-IM Toradol 60 mg, IM Reglan 10 mg each given in clinic prior to discharge today. Advised patient may take OTC extra strength Excedrin 1-2 tabs daily, as needed for headache.  Advised patient if headache worsens and/or unresolved please follow-up with PCP, neurology or here for further evaluation.  Work note provided to patient prior to discharge per her request.  Patient discharged home, hemodynamically stable. Final Clinical Impressions(s) / UC Diagnoses   Final diagnoses:  Bad headache     Discharge Instructions      Advised patient may take OTC extra strength Excedrin 1-2 tabs daily, as needed for headache.  Advised patient if headache worsens and/or unresolved please follow-up with PCP, neurology or here for further evaluation.     ED Prescriptions   None    PDMP not reviewed this encounter.   Trevor Iha, FNP 07/03/22 1320

## 2022-07-04 ENCOUNTER — Telehealth: Payer: Self-pay

## 2022-07-04 NOTE — Telephone Encounter (Signed)
LMTRC if any questions or concerns. 

## 2022-10-02 ENCOUNTER — Encounter: Payer: Self-pay | Admitting: Emergency Medicine

## 2022-10-02 ENCOUNTER — Ambulatory Visit
Admission: EM | Admit: 2022-10-02 | Discharge: 2022-10-02 | Disposition: A | Discharge status: Home / Self Care | Payer: BC Managed Care – PPO | Attending: Family Medicine | Admitting: Family Medicine

## 2022-10-02 ENCOUNTER — Other Ambulatory Visit: Payer: Self-pay

## 2022-10-02 DIAGNOSIS — G43119 Migraine with aura, intractable, without status migrainosus: Secondary | ICD-10-CM | POA: Diagnosis not present

## 2022-10-02 MED ORDER — KETOROLAC TROMETHAMINE 30 MG/ML IJ SOLN
60.0000 mg | Freq: Once | INTRAMUSCULAR | Status: AC
  Administered 2022-10-02: 60 mg via INTRAMUSCULAR

## 2022-10-02 NOTE — ED Triage Notes (Signed)
C/o migraine since Monday night

## 2022-10-02 NOTE — ED Triage Notes (Signed)
Patient presents with a hx of migraines.  States she had one start Monday, has done 4 doses of Imitrex and Phenergan, some mild relief today, but pain still bad.

## 2022-10-02 NOTE — Discharge Instructions (Signed)
May continue your usual migraine medications Follow-up with your primary care doctor or neurologist Home today to rest

## 2022-10-02 NOTE — ED Provider Notes (Signed)
[facility]    CSN: [id number] Arrival date & time: 10/02/22  1419      History   Chief Complaint Chief Complaint  Patient presents with   Migraine    HPI Beverly Shepherd is a 52 y.o. female.   Patient has a known migraine condition.  She has not been here for migraine for 3 months.  Currently has had a headache for several days.  Has been taking Imitrex and Phenergan without success.  She states her headache is severe.  She has photophobia.  She had nausea but this is improved.Marland Kitchen  She was unable to go to work today. She has had no trauma or head injury.  No infections or sinus drainage.  No trouble with strength, coordination, or balance    Past Medical History:  Diagnosis Date   Anxiety    GERD (gastroesophageal reflux disease)    IBS (irritable bowel syndrome)    Insomnia    Migraine    Palpitations     Patient Active Problem List   Diagnosis Date Noted   Small bowel obstruction (HCC) 07/23/2015   MIGRAINE, COMMON 11/29/2009   CERVICAL SPASM 11/29/2009    Past Surgical History:  Procedure Laterality Date   ABDOMINAL HYSTERECTOMY      OB History   No obstetric history on file.      Home Medications    Prior to Admission medications   Medication Sig Start Date End Date Taking? Authorizing Provider  lamoTRIgine (LAMICTAL) 100 MG tablet Take 100 mg by mouth daily.   Yes [provider]  ALPRAZolam Prudy Feeler) 1 MG tablet Take 1 mg by mouth daily. 09/22/14   [provider]  aspirin EC 81 MG tablet Take 81 mg by mouth daily.    [provider]  Galcanezumab-gnlm [facility]) Inject into the skin.    [provider]  ibuprofen (ADVIL,MOTRIN) 200 MG tablet Take 800 mg by mouth daily as needed for headache or moderate pain.    [provider]  lithium carbonate 300 MG capsule Take 300 mg by mouth daily.     [provider]  loratadine (CLARITIN) 10 MG tablet Take 10 mg by mouth daily.    [provider]  mirtazapine (REMERON) 15 MG tablet Take 15 mg by mouth at bedtime.    [provider]  mupirocin ointment (BACTROBAN) 2 % Apply 1 Application topically 2 (two) times daily. 05/09/22   Rising, Lurena Joiner, PA-C  omeprazole (PRILOSEC) 20 MG capsule Take 20 mg by mouth daily.    [provider]  PARoxetine (PAXIL) 20 MG tablet Take 50 mg by mouth daily.    [provider]  promethazine-dextromethorphan (PROMETHAZINE-DM) 6.25-15 MG/5ML syrup Take 5 mLs by mouth 4 (four) times daily as needed for cough. 03/12/22   Eustace Moore, MD  ranitidine (ZANTAC) 300 MG tablet Take 300 mg by mouth at bedtime. Patient not taking: Reported on 03/12/2022    [provider]  Semaglutide,0.25 or 0.5MG /DOS, (OZEMPIC, 0.25 OR 0.5 MG/DOSE,) 2 MG/1.5ML SOPN Inject into the skin.    [provider]  SUMAtriptan (IMITREX) 100 MG tablet Take 100 mg by mouth 2 (two) times daily as needed for migraine.  07/11/15   [provider]  Suvorexant (BELSOMRA) 10 MG TABS Take by mouth.    [provider]  valACYclovir (VALTREX) 1000 MG tablet Take 500 mg by mouth daily. 07/11/15   [provider]    Family History Family History  Problem Relation Age of Onset   Congestive Heart Failure Father    Diabetes Father    Hypertension Father    COPD Father    Healthy Mother     Social History Social History   Tobacco Use   Smoking status: Never   Smokeless tobacco: Never  Vaping Use   Vaping status: Never Used  Substance Use Topics   Alcohol use: Yes    Alcohol/week: 0.0 standard drinks of alcohol    Comment: occasionally   Drug use: No     Allergies   Codeine phosphate [codeine], Duloxetine, Prednisone, and Sertraline   Review of Systems Review of Systems  See HPI Physical Exam Triage Vital Signs ED Triage Vitals  Encounter Vitals Group     BP 10/02/22 1433 109/75     Systolic BP Percentile --      Diastolic BP Percentile --       Pulse Rate 10/02/22 1433 74     Resp 10/02/22 1433 18     Temp 10/02/22 1433 98.6 F (37 C)     Temp Source 10/02/22 1433 Oral     SpO2 10/02/22 1433 97 %     Weight --      Height --      Head Circumference --      Peak Flow --      Pain Score 10/02/22 1431 7     Pain Loc --      Pain Education --      Exclude from Growth Chart --    No data found.  Updated Vital Signs BP 109/75 (BP Location: Right Arm)   Pulse 74   Temp 98.6 F (37 C) (Oral)   Resp 18   SpO2 97%       Physical Exam Constitutional:      General: She is in acute distress.     Comments: Patient is very uncomfortable.  Exam room is darkened  HENT:     Right Ear: Tympanic membrane normal.     Left Ear: Tympanic membrane normal.     Nose: No congestion or rhinorrhea.     Mouth/Throat:     Mouth: Mucous membranes are moist.  Eyes:     Extraocular Movements: Extraocular movements intact.     Pupils: Pupils are equal, round, and reactive to light.     Comments: Eye exam difficult due to photophobia.  Discs appear flat  Neurological:     General: No focal deficit present.     Gait: Gait normal.      UC Treatments / Results  Labs (all labs ordered are listed, but only abnormal results are displayed) Labs Reviewed - No data to display  EKG   Radiology No results found.  Procedures Procedures (including critical care time)  Medications Ordered in UC Medications  ketorolac (TORADOL) 30 MG/ML injection 60 mg (60 mg Intramuscular Given 10/02/22 1452)    Initial Impression / Assessment and Plan / UC Course  I have reviewed the triage vital signs and the nursing notes.  Pertinent labs & imaging results that were available during my care of the patient were reviewed by me and considered in my medical decision making (see chart for details).     Migraine headache. Final Clinical Impressions(s) / UC Diagnoses   Final diagnoses:  Intractable migraine with aura without status migrainosus      Discharge Instructions      May continue your usual migraine medications Follow-up with your primary care doctor  or neurologist Home today to rest     ED Prescriptions   None    PDMP not reviewed this encounter.   Eustace Moore, MD 10/02/22 [phone]

## 2022-10-12 ENCOUNTER — Ambulatory Visit
Admission: EM | Admit: 2022-10-12 | Discharge: 2022-10-12 | Disposition: A | Discharge status: Home / Self Care | Payer: BC Managed Care – PPO | Attending: Family Medicine | Admitting: Family Medicine

## 2022-10-12 ENCOUNTER — Ambulatory Visit (INDEPENDENT_AMBULATORY_CARE_PROVIDER_SITE_OTHER): Payer: BC Managed Care – PPO

## 2022-10-12 DIAGNOSIS — M659 Synovitis and tenosynovitis, unspecified: Secondary | ICD-10-CM | POA: Diagnosis not present

## 2022-10-12 DIAGNOSIS — M79672 Pain in left foot: Secondary | ICD-10-CM

## 2022-10-12 MED ORDER — NAPROXEN 500 MG PO TABS: 500.0000 mg | ORAL_TABLET | Freq: Two times a day (BID) | ORAL | 0 refills | Status: DC

## 2022-10-12 NOTE — ED Triage Notes (Signed)
Pt presents to uc with co of left foot pain. Pt reports she was seen on Tuesday and was given medication for gout with no improvement in symptoms. Pt reports worsening of pain since last night when she bent down to get into her locker at work. Pt has taken otc tylenol and motrin.

## 2022-10-12 NOTE — Discharge Instructions (Signed)
Take the naproxen 2 x a day Use ice to painful foot for 20 min a couple of times a day Wear supportive shoe See orthopedic if fails to improve

## 2022-10-12 NOTE — ED Provider Notes (Signed)
[facility]    CSN: [id number] Arrival date & time: 10/12/22  1029      History   Chief Complaint Chief Complaint  Patient presents with   Foot Pain    HPI Beverly Shepherd is a 52 y.o. female.   HPI  Patient saw her PCP for foot pain of days duration.  She ws diagnosed with gout and given colchicine.  It has not helped at all and foot still swollen and painful.  Does not recall trauma but has a dog that steps on her foot all the time.  Never had gout.     Past Medical History:  Diagnosis Date   Anxiety    GERD (gastroesophageal reflux disease)    IBS (irritable bowel syndrome)    Insomnia    Migraine    Palpitations     Patient Active Problem List   Diagnosis Date Noted   Small bowel obstruction (HCC) 07/23/2015   MIGRAINE, COMMON 11/29/2009   CERVICAL SPASM 11/29/2009    Past Surgical History:  Procedure Laterality Date   ABDOMINAL HYSTERECTOMY      OB History   No obstetric history on file.      Home Medications    Prior to Admission medications   Medication Sig Start Date End Date Taking? Authorizing Provider  naproxen (NAPROSYN) 500 MG tablet Take 1 tablet (500 mg total) by mouth 2 (two) times daily. 10/12/22  Yes Eustace Moore, MD  ALPRAZolam Prudy Feeler) 1 MG tablet Take 1 mg by mouth daily. 09/22/14   [provider]  aspirin EC 81 MG tablet Take 81 mg by mouth daily.    [provider]  Galcanezumab-gnlm [facility]) Inject into the skin.    [provider]  lamoTRIgine (LAMICTAL) 100 MG tablet Take 100 mg by mouth daily.    [provider]  lithium carbonate 300 MG capsule Take 300 mg by mouth daily.     [provider]  loratadine (CLARITIN) 10 MG tablet Take 10 mg by mouth daily.    [provider]  mirtazapine (REMERON) 15 MG tablet Take 15 mg by mouth at bedtime.    [provider]  omeprazole (PRILOSEC) 20 MG capsule Take 20 mg by mouth daily.    [provider]  PARoxetine (PAXIL) 20 MG tablet Take 50 mg by mouth daily.    [provider]  Semaglutide,0.25 or 0.5MG /DOS, (OZEMPIC, 0.25 OR 0.5 MG/DOSE,) 2 MG/1.5ML SOPN Inject into the skin.    [provider]  SUMAtriptan (IMITREX) 100 MG tablet Take 100 mg by mouth 2 (two) times daily as needed for migraine.  07/11/15   [provider]  Suvorexant (BELSOMRA) 10 MG TABS Take by mouth.    [provider]  valACYclovir (VALTREX) 1000 MG tablet Take 500 mg by mouth daily. 07/11/15   [provider]    Family History Family History  Problem Relation Age of Onset   Congestive Heart Failure Father    Diabetes Father    Hypertension Father    COPD Father    Healthy Mother     Social History Social History   Tobacco Use   Smoking status: Never   Smokeless tobacco: Never  Vaping Use   Vaping status: Never Used  Substance Use Topics   Alcohol use: Yes    Alcohol/week: 0.0 standard drinks of alcohol    Comment: occasionally   Drug use: No     Allergies   Codeine phosphate [  codeine], Duloxetine, Prednisone, and Sertraline   Review of Systems Review of Systems See HPI  Physical Exam Triage Vital Signs ED Triage Vitals  Encounter Vitals Group     BP 10/12/22 1039 (!) 142/84     Systolic BP Percentile --      Diastolic BP Percentile --      Pulse Rate 10/12/22 1039 80     Resp 10/12/22 1039 16     Temp 10/12/22 1039 (!) 97.5 F (36.4 C)     Temp src --      SpO2 10/12/22 1039 98 %     Weight --      Height --      Head Circumference --      Peak Flow --      Pain Score 10/12/22 1038 6     Pain Loc --      Pain Education --      Exclude from Growth Chart --    No data found.  Updated Vital Signs BP (!) 142/84   Pulse 80   Temp (!) 97.5 F (36.4 C)   Resp 16   SpO2 98%       Physical Exam Constitutional:      General: She is not in acute distress.    Appearance: She is well-developed.  HENT:     Head:  Normocephalic and atraumatic.  Eyes:     Conjunctiva/sclera: Conjunctivae normal.     Pupils: Pupils are equal, round, and reactive to light.  Cardiovascular:     Rate and Rhythm: Normal rate.  Pulmonary:     Effort: Pulmonary effort is normal. No respiratory distress.  Abdominal:     General: There is no distension.     Palpations: Abdomen is soft.  Musculoskeletal:        General: Normal range of motion.     Cervical back: Normal range of motion.       Feet:  Skin:    General: Skin is warm and dry.  Neurological:     Mental Status: She is alert.     Gait: Gait abnormal.      UC Treatments / Results  Labs (all labs ordered are listed, but only abnormal results are displayed) Labs Reviewed - No data to display  EKG   Radiology DG Foot Complete Left  Result Date: 10/12/2022 CLINICAL DATA:  Left foot injury. EXAM: LEFT FOOT - COMPLETE 3+ VIEW COMPARISON:  None Available. FINDINGS: There is no evidence of fracture or dislocation. There is no evidence of arthropathy or other focal bone abnormality. Soft tissues are unremarkable. IMPRESSION: Negative. Electronically Signed   By: Obie Dredge M.D.   On: 10/12/2022 11:01    Procedures Procedures (including critical care time)  Medications Ordered in UC Medications - No data to display  Initial Impression / Assessment and Plan / UC Course  I have reviewed the triage vital signs and the nursing notes.  Pertinent labs & imaging results that were available during my care of the patient were reviewed by me and considered in my medical decision making (see chart for details).     Final Clinical Impressions(s) / UC Diagnoses   Final diagnoses:  Tenosynovitis of left foot     Discharge Instructions      Take the naproxen 2 x a day Use ice to painful foot for 20 min a couple of times a day Wear supportive shoe See orthopedic if fails to improve     ED Prescriptions  Medication Sig Dispense Auth. Provider    naproxen (NAPROSYN) 500 MG tablet Take 1 tablet (500 mg total) by mouth 2 (two) times daily. 30 tablet Eustace Moore, MD      I have reviewed the PDMP during this encounter.   Eustace Moore, MD 10/12/22 [phone]

## 2022-10-16 ENCOUNTER — Ambulatory Visit
Admission: EM | Admit: 2022-10-16 | Discharge: 2022-10-16 | Disposition: A | Discharge status: Home / Self Care | Payer: BC Managed Care – PPO | Attending: Family Medicine | Admitting: Family Medicine

## 2022-10-16 DIAGNOSIS — M79672 Pain in left foot: Secondary | ICD-10-CM | POA: Diagnosis not present

## 2022-10-16 DIAGNOSIS — G43019 Migraine without aura, intractable, without status migrainosus: Secondary | ICD-10-CM | POA: Diagnosis not present

## 2022-10-16 MED ORDER — KETOROLAC TROMETHAMINE 30 MG/ML IJ SOLN
30.0000 mg | Freq: Once | INTRAMUSCULAR | Status: AC
  Administered 2022-10-16: 30 mg via INTRAMUSCULAR

## 2022-10-16 MED ORDER — METOCLOPRAMIDE HCL 5 MG/ML IJ SOLN
5.0000 mg | Freq: Once | INTRAMUSCULAR | Status: AC
  Administered 2022-10-16: 5 mg via INTRAMUSCULAR

## 2022-10-16 NOTE — ED Triage Notes (Signed)
Pt reports right sided headache x 1 day. Reports she took 2 Imitrex yesterday without relief. Sattes she has Imitrex at home and did not tae more as he insurance just pay for 12 tab a month and she do not want to run off.

## 2022-10-16 NOTE — ED Provider Notes (Signed)
[facility]    CSN: [id number] Arrival date & time: 10/16/22  1538      History   Chief Complaint Chief Complaint  Patient presents with   Headache    HPI Beverly Shepherd is a 52 y.o. female.   HPI  Patient has a known migraine condition.  She is on preventative medications including injections and Inderal.  She gets Imitrex to take for headaches but only gets 12 months because of her insurance.  She has used these up and has another headache today.  She was just here for a shot of Toradol a few days ago.  I have recommended she talk to her neurologist about beefing up her prevention. Currently has typical migraine since yesterday.  States she needs a shot today so she will not miss any more time from work.  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      Home Medications    Prior to Admission medications   Medication Sig Start Date End Date Taking? Authorizing Provider  pravastatin (PRAVACHOL) 10 MG tablet Take 10 mg by mouth daily.   Yes [provider]  propranolol (INDERAL) 20 MG tablet Take 20 mg by mouth 3 (three) times daily.   Yes [provider]  ALPRAZolam Prudy Feeler) 1 MG tablet Take 1 mg by mouth daily. 09/22/14   [provider]  aspirin EC 81 MG tablet Take 81 mg by mouth daily.    [provider]  busPIRone (BUSPAR) 7.5 MG tablet Take 1 tablet by mouth 2 (two) times daily.    [provider]  Galcanezumab-gnlm [facility]) Inject  into the skin.    [provider]  lamoTRIgine (LAMICTAL) 100 MG tablet Take 100 mg by mouth daily.    [provider]  lithium carbonate 300 MG capsule Take 300 mg by mouth daily.     [provider]  loratadine (CLARITIN) 10 MG tablet Take 10 mg by mouth daily.    [provider]  mirtazapine (REMERON) 15 MG tablet Take 15 mg by mouth at bedtime.    [provider]  naproxen (NAPROSYN) 500 MG tablet Take 1 tablet (500 mg total) by mouth 2 (two) times daily. 10/12/22   Eustace Moore, MD  omeprazole (PRILOSEC) 20 MG capsule Take 20 mg by mouth daily.    [provider]  PARoxetine (PAXIL) 20 MG tablet Take 50 mg by mouth daily.    [provider]  Semaglutide,0.25 or 0.5MG /DOS, (OZEMPIC, 0.25 OR 0.5 MG/DOSE,) 2 MG/1.5ML SOPN Inject into the skin.    [provider]  SUMAtriptan (IMITREX) 100 MG tablet Take 100 mg by mouth 2 (two) times daily as needed for migraine.  07/11/15   [provider]  Suvorexant (BELSOMRA) 10 MG TABS Take by mouth.    [provider]  valACYclovir (VALTREX) 1000 MG tablet Take 500 mg by mouth daily. 07/11/15  [provider]    Family History Family History  Problem Relation Age of Onset   Congestive Heart Failure Father    Diabetes Father    Hypertension Father    COPD Father    Healthy Mother     Social History Social History   Tobacco Use   Smoking status: Never   Smokeless tobacco: Never  Vaping Use   Vaping status: Never Used  Substance Use Topics   Alcohol use: Yes    Alcohol/week: 0.0 standard drinks of alcohol    Comment: occasionally   Drug use: Yes    Frequency: 1.0 times per week    Types: Marijuana     Allergies   Latex, Codeine phosphate [codeine], Duloxetine, Prednisone, and Sertraline   Review of Systems Review of Systems See HPI  Physical Exam Triage Vital Signs ED Triage Vitals  Encounter Vitals Group     BP 10/16/22 1547  126/82     Systolic BP Percentile --      Diastolic BP Percentile --      Pulse Rate 10/16/22 1547 98     Resp 10/16/22 1547 16     Temp 10/16/22 1547 98 F (36.7 C)     Temp Source 10/16/22 1547 Oral     SpO2 10/16/22 1547 97 %     Weight --      Height --      Head Circumference --      Peak Flow --      Pain Score 10/16/22 1552 7     Pain Loc --      Pain Education --      Exclude from Growth Chart --    No data found.  Updated Vital Signs BP 126/82 (BP Location: Right Arm)   Pulse 98   Temp 98 F (36.7 C) (Oral)   Resp 16   SpO2 97%       Physical Exam Constitutional:      General: She is not in acute distress.    Appearance: She is well-developed.  HENT:     Head: Normocephalic and atraumatic.  Eyes:     General: No visual field deficit.    Conjunctiva/sclera: Conjunctivae normal.     Pupils: Pupils are equal, round, and reactive to light.  Cardiovascular:     Rate and Rhythm: Normal rate.  Pulmonary:     Effort: Pulmonary effort is normal. No respiratory distress.  Abdominal:     General: There is no distension.     Palpations: Abdomen is soft.  Musculoskeletal:        General: Normal range of motion.     Cervical back: Normal range of motion.       Feet:  Skin:    General: Skin is warm and dry.  Neurological:     Mental Status: She is alert. Mental status is at baseline.     Cranial Nerves: No cranial nerve deficit, dysarthria or facial asymmetry.      UC Treatments / Results  Labs (all labs ordered are listed, but only abnormal results are displayed) Labs Reviewed - No data to display  EKG   Radiology No results found.  Procedures Procedures (including critical care time)  Medications Ordered in UC Medications  ketorolac (TORADOL) 30 MG/ML injection 30 mg (has no administration in time range)  metoCLOPramide (REGLAN) injection 5 mg (has no administration in time range)    Initial Impression / Assessment and Plan / UC Course  I  have reviewed the triage vital signs and the nursing notes.  Pertinent labs & imaging results that were available during my care of the patient were reviewed by me and considered in my medical decision making (see chart for details).      Final Clinical Impressions(s) / UC Diagnoses   Final diagnoses:  Intractable migraine without aura and without status migrainosus  Acute foot pain, left     Discharge Instructions      Talk to your primary care doctor or neurologist about migraine prevention  See orthopedic if your foot pain fails to improve     ED Prescriptions   None    PDMP not reviewed this encounter.   Eustace Moore, MD 10/16/22 [phone]

## 2022-10-16 NOTE — Discharge Instructions (Signed)
Talk to your primary care doctor or neurologist about migraine prevention  See orthopedic if your foot pain fails to improve

## 2023-02-18 ENCOUNTER — Other Ambulatory Visit: Payer: Self-pay

## 2023-02-18 ENCOUNTER — Ambulatory Visit
Admission: EM | Admit: 2023-02-18 | Discharge: 2023-02-18 | Disposition: A | Discharge status: Home / Self Care | Payer: BC Managed Care – PPO | Attending: Family Medicine | Admitting: Family Medicine

## 2023-02-18 DIAGNOSIS — R059 Cough, unspecified: Secondary | ICD-10-CM

## 2023-02-18 DIAGNOSIS — J069 Acute upper respiratory infection, unspecified: Secondary | ICD-10-CM

## 2023-02-18 MED ORDER — BENZONATATE 200 MG PO CAPS: 200.0000 mg | ORAL_CAPSULE | Freq: Three times a day (TID) | ORAL | 0 refills | Status: AC | PRN

## 2023-02-18 MED ORDER — AZITHROMYCIN 250 MG PO TABS: 250.0000 mg | ORAL_TABLET | Freq: Every day | ORAL | 0 refills | Status: DC

## 2023-02-18 NOTE — Discharge Instructions (Addendum)
Advised patient to take medication as directed with food to completion.  Advised patient may take Tessalon capsules daily or as needed for cough.  Encouraged to increase daily water intake to 64 ounces per day while taking these medications.  Advised if symptoms worsen and/or unresolved please follow-up with PCP or here for further evaluation.

## 2023-02-18 NOTE — ED Triage Notes (Signed)
Pt c/o cough, congestion and runny nose x 3 days. Taking mucinex prn.

## 2023-02-18 NOTE — ED Provider Notes (Signed)
[facility]    CSN: [id number] Arrival date & time: 02/18/23  1858      History   Chief Complaint Chief Complaint  Patient presents with   Cough    HPI Beverly Shepherd is a 52 y.o. female.   HPI 52 year old female presents with cough.  PMH significant for migraine, IBS, and anxiety.  Past Medical History:  Diagnosis Date   Anxiety    GERD (gastroesophageal reflux disease)    IBS (irritable bowel syndrome)    Insomnia    Migraine    Palpitations     Patient Active Problem List   Diagnosis Date Noted   Small bowel obstruction (HCC) 07/23/2015   Migraine without aura 11/29/2009   CERVICAL SPASM 11/29/2009    Past Surgical History:  Procedure Laterality Date   ABDOMINAL HYSTERECTOMY      OB History   No obstetric history on file.      Home Medications    Prior to Admission medications   Medication Sig Start Date End Date Taking? Authorizing Provider  azithromycin (ZITHROMAX) 250 MG tablet Take 1 tablet (250 mg total) by mouth daily. Take first 2 tablets together, then 1 every day until finished. 02/18/23  Yes Trevor Iha, FNP  benzonatate (TESSALON) 200 MG capsule Take 1 capsule (200 mg total) by mouth 3 (three) times daily as needed for up to 7 days. 02/18/23 02/25/23 Yes Trevor Iha, FNP  ALPRAZolam Prudy Feeler) 1 MG tablet Take 1 mg by mouth daily. 09/22/14   [provider]  aspirin EC 81 MG tablet Take 81 mg by mouth daily.    [provider]  busPIRone (BUSPAR) 7.5 MG tablet Take 1 tablet by mouth 2 (two) times daily.    [provider]  Galcanezumab-gnlm [facility] [place]) Inject into the skin.    [provider]  lamoTRIgine (LAMICTAL) 100 MG tablet Take 100 mg by mouth daily.    [provider]  lithium carbonate 300 MG capsule Take 300 mg by mouth daily.     [provider]  loratadine (CLARITIN) 10 MG tablet Take 10 mg by mouth daily.    [provider]  mirtazapine (REMERON)  15 MG tablet Take 15 mg by mouth at bedtime.    [provider]  naproxen (NAPROSYN) 500 MG tablet Take 1 tablet (500 mg total) by mouth 2 (two) times daily. 10/12/22   Eustace Moore, MD  omeprazole (PRILOSEC) 20 MG capsule Take 20 mg by mouth daily.    [provider]  PARoxetine (PAXIL) 20 MG tablet Take 50 mg by mouth daily.    [provider]  pravastatin (PRAVACHOL) 10 MG tablet Take 10 mg by mouth daily.    [provider]  propranolol (INDERAL) 20 MG tablet Take 20 mg by mouth 3 (three) times daily.    [provider]  Semaglutide,0.25 or 0.5MG /DOS, (OZEMPIC, 0.25 OR 0.5 MG/DOSE,) 2 MG/1.5ML SOPN Inject into the skin.    [provider]  SUMAtriptan (IMITREX) 100 MG tablet Take 100 mg by mouth 2 (two) times daily as needed for migraine.  07/11/15   [provider]  Suvorexant (BELSOMRA) 10 MG TABS Take by mouth.    [provider]  valACYclovir (VALTREX) 1000 MG tablet Take 500 mg by mouth daily. 07/11/15   [provider]    Family History Family History  Problem Relation Age of Onset   Congestive Heart Failure Father    Diabetes Father    Hypertension  Father    COPD Father    Healthy Mother     Social History Social History   Tobacco Use   Smoking status: Never   Smokeless tobacco: Never  Vaping Use   Vaping status: Never Used  Substance Use Topics   Alcohol use: Yes    Alcohol/week: 0.0 standard drinks of alcohol    Comment: occasionally   Drug use: Yes    Frequency: 1.0 times per week    Types: Marijuana     Allergies   Latex, Codeine phosphate [codeine], Duloxetine, Prednisone, and Sertraline   Review of Systems Review of Systems  Respiratory:  Positive for cough.      Physical Exam Triage Vital Signs ED Triage Vitals  Encounter Vitals Group     BP      Systolic BP Percentile      Diastolic BP Percentile      Pulse      Resp      Temp      Temp src      SpO2       Weight      Height      Head Circumference      Peak Flow      Pain Score      Pain Loc      Pain Education      Exclude from Growth Chart    No data found.  Updated Vital Signs BP 129/86 (BP Location: Right Arm)   Pulse 93   Temp 97.8 F (36.6 C) (Oral)   Resp 17   SpO2 96%    Physical Exam Vitals and nursing note reviewed.  Constitutional:      Appearance: Normal appearance. She is obese. She is ill-appearing.  HENT:     Head: Normocephalic and atraumatic.     Right Ear: Tympanic membrane, ear canal and external ear normal.     Left Ear: Tympanic membrane, ear canal and external ear normal.     Mouth/Throat:     Mouth: Mucous membranes are moist.     Pharynx: Oropharynx is clear.  Eyes:     Extraocular Movements: Extraocular movements intact.     Conjunctiva/sclera: Conjunctivae normal.     Pupils: Pupils are equal, round, and reactive to light.  Cardiovascular:     Rate and Rhythm: Normal rate and regular rhythm.     Pulses: Normal pulses.     Heart sounds: Normal heart sounds.  Pulmonary:     Effort: Pulmonary effort is normal.     Breath sounds: Normal breath sounds. No wheezing, rhonchi or rales.     Comments: Infrequent nonproductive cough noted on exam Musculoskeletal:        General: Normal range of motion.     Cervical back: Normal range of motion and neck supple. No tenderness.  Lymphadenopathy:     Cervical: Cervical adenopathy present.  Skin:    General: Skin is warm and dry.  Neurological:     General: No focal deficit present.     Mental Status: She is alert and oriented to person, place, and time. Mental status is at baseline.  Psychiatric:        Mood and Affect: Mood normal.        Behavior: Behavior normal.        Thought Content: Thought content normal.      UC Treatments / Results  Labs (all labs ordered are listed, but only abnormal results are displayed) Labs Reviewed - No  data to display  EKG   Radiology No results  found.  Procedures Procedures (including critical care time)  Medications Ordered in UC Medications - No data to display  Initial Impression / Assessment and Plan / UC Course  I have reviewed the triage vital signs and the nursing notes.  Pertinent labs & imaging results that were available during my care of the patient were reviewed by me and considered in my medical decision making (see chart for details).     MDM: 1.  Acute upper respiratory infection-Rx'd Zithromax (500 mg day 1, then 250 mg day 2-5); 2.  Cough, unspecified type-Rx'd Tessalon 200 mg capsules: Take 1 capsule 3 times daily, as needed. Advised patient to take medication as directed with food to completion.  Advised patient may take Tessalon capsules daily or as needed for cough.  Encouraged to increase daily water intake to 64 ounces per day while taking these medications.  Advised if symptoms worsen and/or unresolved please follow-up with PCP or here for further evaluation.  Patient discharged home, hemodynamically stable. Final Clinical Impressions(s) / UC Diagnoses   Final diagnoses:  Acute upper respiratory infection  Cough, unspecified type     Discharge Instructions      Advised patient to take medication as directed with food to completion.  Advised patient may take Tessalon capsules daily or as needed for cough.  Encouraged to increase daily water intake to 64 ounces per day while taking these medications.  Advised if symptoms worsen and/or unresolved please follow-up with PCP or here for further evaluation.     ED Prescriptions     Medication Sig Dispense Auth. Provider   azithromycin (ZITHROMAX) 250 MG tablet Take 1 tablet (250 mg total) by mouth daily. Take first 2 tablets together, then 1 every day until finished. 6 tablet Trevor Iha, FNP   benzonatate (TESSALON) 200 MG capsule Take 1 capsule (200 mg total) by mouth 3 (three) times daily as needed for up to 7 days. 40 capsule Trevor Iha, FNP       PDMP not reviewed this encounter.   Trevor Iha, FNP 02/18/23 1932

## 2023-03-11 ENCOUNTER — Ambulatory Visit: Payer: BC Managed Care – PPO

## 2023-03-11 ENCOUNTER — Ambulatory Visit
Admission: RE | Admit: 2023-03-11 | Discharge: 2023-03-11 | Disposition: A | Discharge status: Home / Self Care | Payer: BC Managed Care – PPO | Source: Ambulatory Visit | Attending: Family Medicine | Admitting: Family Medicine

## 2023-03-11 ENCOUNTER — Telehealth: Payer: Self-pay

## 2023-03-11 VITALS — BP 160/91 | HR 106 | Temp 98.2°F | Resp 17

## 2023-03-11 DIAGNOSIS — R051 Acute cough: Secondary | ICD-10-CM | POA: Diagnosis not present

## 2023-03-11 DIAGNOSIS — R058 Other specified cough: Secondary | ICD-10-CM | POA: Diagnosis not present

## 2023-03-11 MED ORDER — AMOXICILLIN-POT CLAVULANATE 875-125 MG PO TABS: 1.0000 | ORAL_TABLET | Freq: Two times a day (BID) | ORAL | 0 refills | Status: AC

## 2023-03-11 MED ORDER — ALBUTEROL SULFATE (2.5 MG/3ML) 0.083% IN NEBU
2.5000 mg | INHALATION_SOLUTION | Freq: Once | RESPIRATORY_TRACT | Status: AC
  Administered 2023-03-11: 2.5 mg via RESPIRATORY_TRACT

## 2023-03-11 MED ORDER — ALBUTEROL SULFATE HFA 108 (90 BASE) MCG/ACT IN AERS: 1.0000 | INHALATION_SPRAY | Freq: Four times a day (QID) | RESPIRATORY_TRACT | 0 refills | Status: AC | PRN

## 2023-03-11 MED ORDER — PREDNISONE 20 MG PO TABS: 20.0000 mg | ORAL_TABLET | Freq: Every day | ORAL | 0 refills | Status: AC

## 2023-03-11 NOTE — Discharge Instructions (Signed)
 Take Augmentin  2 times a day for 7 days.  Take Augmentin  with food Take prednisone  once a day for 7 days.  Take this in the morning with your daily Augmentin  I have prescribed albuterol  to use as needed for wheezing and for coughing spells, shortness of breath Continue drink lots of fluids Try to get more rest See your doctor if not improving by next week

## 2023-03-11 NOTE — ED Triage Notes (Signed)
Pt c/o cough x 3 weeks. Some chest tightness and wheezing. Was seen in UC on 12/10, Tx with tessalon. Worsening in the last week. Taking mucinex prn.

## 2023-03-11 NOTE — ED Provider Notes (Addendum)
 [facility]    CSN: [id number] Arrival date & time: 03/11/23  9177      History   Chief Complaint Chief Complaint  Patient presents with   Cough    815AM    HPI Beverly Shepherd is a 52 y.o. female.   Patient was seen approximately 2 weeks ago for a cough.  She is treated with a Z-Pak and Tessalon .  She states she felt mildly improved and then had a worsening of condition.  She states around a week to week and a half ago she started having fever and chills, congestion, increased coughing.  She wonders if she caught a flu or COVID in addition to her bronchitis.  She continues to have coughing.  She feels very tired.  She feels short of breath.  She is having some wheezing.  She has chest discomfort with deep breath.  Poor exercise tolerance.  No underlying asthma.  Non-smoker    Past Medical History:  Diagnosis Date   Anxiety    GERD (gastroesophageal reflux disease)    IBS (irritable bowel syndrome)    Insomnia    Migraine    Palpitations     Patient Active Problem List   Diagnosis Date Noted   Small bowel obstruction (HCC) 07/23/2015   Migraine without aura 11/29/2009   CERVICAL SPASM 11/29/2009    Past Surgical History:  Procedure Laterality Date   ABDOMINAL HYSTERECTOMY      OB History   No obstetric history on file.      Home Medications    Prior to Admission medications   Medication Sig Start Date End Date Taking? Authorizing Provider  albuterol  (VENTOLIN  HFA) 108 (90 Base) MCG/ACT inhaler Inhale 1-2 puffs into the lungs every 6 (six) hours as needed for wheezing or shortness of breath. 03/11/23  Yes Maranda Jamee Jacob, MD  amoxicillin -clavulanate (AUGMENTIN ) 875-125 MG tablet Take 1 tablet by mouth every 12 (twelve) hours. 03/11/23  Yes Maranda Jamee Jacob, MD  predniSONE  (DELTASONE ) 20 MG tablet Take 1 tablet (20 mg total) by mouth daily with breakfast. 03/11/23  Yes Maranda Jamee Jacob, MD  ALPRAZolam (XANAX) 1 MG tablet Take 1 mg by  mouth daily. 09/22/14   [provider]  aspirin EC 81 MG tablet Take 81 mg by mouth daily.    [provider]  busPIRone (BUSPAR) 7.5 MG tablet Take 1 tablet by mouth 2 (two) times daily.    [provider]  Galcanezumab-gnlm [facility] [place]) Inject into the skin.    [provider]  lamoTRIgine (LAMICTAL) 100 MG tablet Take 100 mg by mouth daily.    [provider]  lithium carbonate 300 MG capsule Take 300 mg by mouth daily.     [provider]  loratadine (CLARITIN) 10 MG tablet Take 10 mg by mouth daily.    [provider]  mirtazapine (REMERON) 15 MG tablet Take 15 mg by mouth at bedtime.    [provider]  omeprazole (PRILOSEC) 20 MG capsule Take 20 mg by mouth daily.    [provider]  PARoxetine (PAXIL) 20 MG tablet Take 50 mg by mouth daily.    [provider]  pravastatin (PRAVACHOL) 10 MG tablet Take 10 mg by mouth daily.    [provider]  propranolol (INDERAL) 20 MG tablet Take 20 mg by mouth 3 (three) times daily.    [provider]  Semaglutide,0.25 or 0.5MG /DOS, (OZEMPIC, 0.25 OR 0.5 MG/DOSE,) 2 MG/1.5ML SOPN Inject into the  skin.    [provider]  SUMAtriptan  (IMITREX ) 100 MG tablet Take 100 mg by mouth 2 (two) times daily as needed for migraine.  07/11/15   [provider]  Suvorexant (BELSOMRA) 10 MG TABS Take by mouth.    [provider]  valACYclovir (VALTREX) 1000 MG tablet Take 500 mg by mouth daily. 07/11/15   [provider]    Family History Family History  Problem Relation Age of Onset   Congestive Heart Failure Father    Diabetes Father    Hypertension Father    COPD Father    Healthy Mother     Social History Social History   Tobacco Use   Smoking status: Never   Smokeless tobacco: Never  Vaping Use   Vaping status: Never Used  Substance Use Topics   Alcohol use: Yes    Alcohol/week: 0.0 standard drinks of  alcohol    Comment: occasionally   Drug use: Yes    Frequency: 1.0 times per week    Types: Marijuana     Allergies   Latex, Duloxetine, Sertraline, Codeine  phosphate [codeine ], and Prednisone    Review of Systems Review of Systems See HPI  Physical Exam Triage Vital Signs ED Triage Vitals  Encounter Vitals Group     BP 03/11/23 0831 (!) 160/91     Systolic BP Percentile --      Diastolic BP Percentile --      Pulse Rate 03/11/23 0831 (!) 106     Resp 03/11/23 0831 17     Temp 03/11/23 0831 98.2 F (36.8 C)     Temp Source 03/11/23 0831 Oral     SpO2 03/11/23 0831 98 %     Weight --      Height --      Head Circumference --      Peak Flow --      Pain Score 03/11/23 0832 0     Pain Loc --      Pain Education --      Exclude from Growth Chart --    No data found.  Updated Vital Signs BP (!) 160/91 (BP Location: Right Arm)   Pulse (!) 106   Temp 98.2 F (36.8 C) (Oral)   Resp 17   SpO2 98%      Physical Exam Constitutional:      General: She is not in acute distress.    Appearance: She is well-developed and normal weight. She is ill-appearing.  HENT:     Head: Normocephalic and atraumatic.     Right Ear: Tympanic membrane and ear canal normal.     Left Ear: Tympanic membrane and ear canal normal.     Mouth/Throat:     Mouth: Mucous membranes are dry.  Eyes:     Conjunctiva/sclera: Conjunctivae normal.     Pupils: Pupils are equal, round, and reactive to light.  Cardiovascular:     Rate and Rhythm: Normal rate and regular rhythm.     Heart sounds: Normal heart sounds.  Pulmonary:     Effort: Pulmonary effort is normal. No respiratory distress.     Breath sounds: Wheezing present.     Comments: Patient unable to take a deep breath, interrupted by cough.  Wheeze present Abdominal:     General: There is no distension.     Palpations: Abdomen is soft.  Musculoskeletal:        General: Normal range of motion.     Cervical back: Normal range of motion.  Skin:    General: Skin is warm and dry.  Neurological:     Mental Status: She is alert.      UC Treatments / Results  Labs (all labs ordered are listed, but only abnormal results are displayed) Labs Reviewed - No data to display  EKG   Radiology DG Chest 2 View Result Date: 03/11/2023 CLINICAL DATA:  Cough for 1 month EXAM: CHEST - 2 VIEW COMPARISON:  None Available. FINDINGS: The heart size and mediastinal contours are within normal limits. No consolidation, pneumothorax or effusion. No edema. Eventration of the right hemidiaphragm. The visualized skeletal structures are unremarkable. Degenerative changes along the spine. IMPRESSION: No acute cardiopulmonary disease. Electronically Signed   By: Ranell Bring M.D.   On: 03/11/2023 11:48  Patient is informed that her x-ray shows no active cardiopulmonary disease  Procedures Procedures (including critical care time)  Medications Ordered in UC Medications  albuterol  (PROVENTIL ) (2.5 MG/3ML) 0.083% nebulizer solution 2.5 mg (2.5 mg Nebulization Given 03/11/23 0853)    Initial Impression / Assessment and Plan / UC Course  I have reviewed the triage vital signs and the nursing notes.  Pertinent labs & imaging results that were available during my care of the patient were reviewed by me and considered in my medical decision making (see chart for details).    Patient has had respiratory symptoms and cough for a month.  Think she may have some bronchial inflammation and post viral cough, will try low-dose of prednisone  to see if she tolerates.  Will also treat with antibiotics because of the longevity of her illness.  Follow-up with PCP Final Clinical Impressions(s) / UC Diagnoses   Final diagnoses:  Acute cough  Post-viral cough syndrome     Discharge Instructions      Take Augmentin  2 times a day for 7 days.  Take Augmentin  with food Take prednisone  once a day for 7 days.  Take this in the morning with your daily  Augmentin  I have prescribed albuterol  to use as needed for wheezing and for coughing spells, shortness of breath Continue drink lots of fluids Try to get more rest See your doctor if not improving by next week     ED Prescriptions     Medication Sig Dispense Auth. Provider   amoxicillin -clavulanate (AUGMENTIN ) 875-125 MG tablet Take 1 tablet by mouth every 12 (twelve) hours. 14 tablet Maranda Jamee Jacob, MD   predniSONE  (DELTASONE ) 20 MG tablet Take 1 tablet (20 mg total) by mouth daily with breakfast. 7 tablet Maranda Jamee Jacob, MD   albuterol  (VENTOLIN  HFA) 108 (90 Base) MCG/ACT inhaler Inhale 1-2 puffs into the lungs every 6 (six) hours as needed for wheezing or shortness of breath. 18 g Maranda Jamee Jacob, MD      I have reviewed the PDMP during this encounter.   Maranda Jamee Jacob, MD 03/11/23 [phone]    Maranda Jamee Jacob, MD 03/11/23 [phone]

## 2023-04-03 ENCOUNTER — Other Ambulatory Visit: Payer: Self-pay | Admitting: Obstetrics and Gynecology

## 2023-04-03 DIAGNOSIS — R928 Other abnormal and inconclusive findings on diagnostic imaging of breast: Secondary | ICD-10-CM

## 2023-04-14 ENCOUNTER — Other Ambulatory Visit: Payer: BC Managed Care – PPO

## 2023-05-09 ENCOUNTER — Other Ambulatory Visit: Payer: BC Managed Care – PPO
